# Patient Record
Sex: Female | Born: 1995 | Race: White | Hispanic: No | Marital: Single | Smoking: Former smoker
Health system: Southern US, Community
[De-identification: ages and names within clinical notes are randomized; demographics above are authoritative.]

## PROBLEM LIST (undated history)

## (undated) DIAGNOSIS — F4310 Post-traumatic stress disorder, unspecified: Secondary | ICD-10-CM

## (undated) DIAGNOSIS — F419 Anxiety disorder, unspecified: Secondary | ICD-10-CM

## (undated) DIAGNOSIS — E039 Hypothyroidism, unspecified: Secondary | ICD-10-CM

## (undated) DIAGNOSIS — F32A Depression, unspecified: Secondary | ICD-10-CM

## (undated) DIAGNOSIS — M419 Scoliosis, unspecified: Secondary | ICD-10-CM

## (undated) HISTORY — DX: Depression, unspecified: F32.A

## (undated) HISTORY — DX: Anxiety disorder, unspecified: F41.9

## (undated) HISTORY — DX: Post-traumatic stress disorder, unspecified: F43.10

## (undated) HISTORY — PX: TONSILLECTOMY: SUR1361

---

## 2015-10-31 ENCOUNTER — Encounter (HOSPITAL_COMMUNITY): Payer: Self-pay | Admitting: Certified Nurse Midwife

## 2015-10-31 ENCOUNTER — Inpatient Hospital Stay (HOSPITAL_COMMUNITY)
Admission: AD | Admit: 2015-10-31 | Discharge: 2015-11-08 | DRG: 769 | Disposition: A | Discharge status: Home / Self Care | Payer: Medicaid Other | Source: Ambulatory Visit | Attending: Obstetrics & Gynecology | Admitting: Obstetrics & Gynecology

## 2015-10-31 ENCOUNTER — Inpatient Hospital Stay (HOSPITAL_COMMUNITY): Payer: Medicaid Other

## 2015-10-31 DIAGNOSIS — O99335 Smoking (tobacco) complicating the puerperium: Secondary | ICD-10-CM | POA: Diagnosis present

## 2015-10-31 DIAGNOSIS — L0291 Cutaneous abscess, unspecified: Secondary | ICD-10-CM | POA: Diagnosis present

## 2015-10-31 DIAGNOSIS — L02211 Cutaneous abscess of abdominal wall: Secondary | ICD-10-CM | POA: Diagnosis not present

## 2015-10-31 DIAGNOSIS — Z880 Allergy status to penicillin: Secondary | ICD-10-CM | POA: Diagnosis not present

## 2015-10-31 DIAGNOSIS — K9289 Other specified diseases of the digestive system: Secondary | ICD-10-CM | POA: Diagnosis present

## 2015-10-31 DIAGNOSIS — I4892 Unspecified atrial flutter: Secondary | ICD-10-CM | POA: Diagnosis present

## 2015-10-31 DIAGNOSIS — O8689 Other specified puerperal infections: Secondary | ICD-10-CM | POA: Diagnosis not present

## 2015-10-31 DIAGNOSIS — T814XXS Infection following a procedure, sequela: Secondary | ICD-10-CM

## 2015-10-31 DIAGNOSIS — Z8679 Personal history of other diseases of the circulatory system: Secondary | ICD-10-CM

## 2015-10-31 DIAGNOSIS — O902 Hematoma of obstetric wound: Secondary | ICD-10-CM

## 2015-10-31 DIAGNOSIS — B370 Candidal stomatitis: Secondary | ICD-10-CM | POA: Diagnosis present

## 2015-10-31 DIAGNOSIS — N732 Unspecified parametritis and pelvic cellulitis: Secondary | ICD-10-CM | POA: Diagnosis present

## 2015-10-31 DIAGNOSIS — F17210 Nicotine dependence, cigarettes, uncomplicated: Secondary | ICD-10-CM | POA: Diagnosis present

## 2015-10-31 DIAGNOSIS — D649 Anemia, unspecified: Secondary | ICD-10-CM | POA: Diagnosis present

## 2015-10-31 DIAGNOSIS — O860 Infection of obstetric surgical wound: Principal | ICD-10-CM | POA: Diagnosis present

## 2015-10-31 DIAGNOSIS — O85 Puerperal sepsis: Secondary | ICD-10-CM | POA: Diagnosis present

## 2015-10-31 DIAGNOSIS — O9963 Diseases of the digestive system complicating the puerperium: Secondary | ICD-10-CM | POA: Diagnosis present

## 2015-10-31 DIAGNOSIS — K219 Gastro-esophageal reflux disease without esophagitis: Secondary | ICD-10-CM | POA: Diagnosis present

## 2015-10-31 DIAGNOSIS — O9081 Anemia of the puerperium: Secondary | ICD-10-CM | POA: Diagnosis present

## 2015-10-31 DIAGNOSIS — N739 Female pelvic inflammatory disease, unspecified: Secondary | ICD-10-CM | POA: Diagnosis present

## 2015-10-31 DIAGNOSIS — Z9889 Other specified postprocedural states: Secondary | ICD-10-CM | POA: Diagnosis not present

## 2015-10-31 DIAGNOSIS — IMO0001 Reserved for inherently not codable concepts without codable children: Secondary | ICD-10-CM

## 2015-10-31 HISTORY — DX: Hypothyroidism, unspecified: E03.9

## 2015-10-31 HISTORY — DX: Scoliosis, unspecified: M41.9

## 2015-10-31 LAB — COMPREHENSIVE METABOLIC PANEL
ALBUMIN: 1.9 g/dL — AB (ref 3.5–5.0)
ALT: 8 U/L — AB (ref 14–54)
AST: 11 U/L — AB (ref 15–41)
Alkaline Phosphatase: 150 U/L — ABNORMAL HIGH (ref 38–126)
Anion gap: 9 (ref 5–15)
BUN: 6 mg/dL (ref 6–20)
CHLORIDE: 102 mmol/L (ref 101–111)
CO2: 24 mmol/L (ref 22–32)
CREATININE: 0.64 mg/dL (ref 0.44–1.00)
Calcium: 8.1 mg/dL — ABNORMAL LOW (ref 8.9–10.3)
GFR calc Af Amer: >60 mL/min (ref >60)
GFR calc non Af Amer: >60 mL/min (ref >60)
Glucose, Bld: 87 mg/dL (ref 65–99)
Potassium: 3.6 mmol/L (ref 3.5–5.1)
SODIUM: 135 mmol/L (ref 135–145)
Total Bilirubin: 0.9 mg/dL (ref 0.3–1.2)
Total Protein: 6.6 g/dL (ref 6.5–8.1)

## 2015-10-31 LAB — CREATININE, SERUM
Creatinine, Ser: 0.68 mg/dL (ref 0.44–1.00)
GFR calc Af Amer: >60 mL/min (ref >60)

## 2015-10-31 LAB — CBC
HEMATOCRIT: 26.5 % — AB (ref 36.0–46.0)
HEMOGLOBIN: 9.1 g/dL — AB (ref 12.0–15.0)
MCH: 31.1 pg (ref 26.0–34.0)
MCHC: 34.3 g/dL (ref 30.0–36.0)
MCV: 90.4 fL (ref 78.0–100.0)
Platelets: 509 10*3/uL — ABNORMAL HIGH (ref 150–400)
RBC: 2.93 MIL/uL — AB (ref 3.87–5.11)
RDW: 14.3 % (ref 11.5–15.5)
WBC: 23.8 10*3/uL — ABNORMAL HIGH (ref 4.0–10.5)

## 2015-10-31 LAB — ABO/RH: ABO/RH(D): O POS

## 2015-10-31 LAB — PREPARE RBC (CROSSMATCH)

## 2015-10-31 MED ORDER — NYSTATIN 100000 UNIT/ML MT SUSP
5.0000 mL | Freq: Four times a day (QID) | OROMUCOSAL | Status: DC
  Administered 2015-10-31 – 2015-11-06 (×20): 500000 [IU] via ORAL
  Filled 2015-10-31 (×39): qty 5

## 2015-10-31 MED ORDER — SODIUM CHLORIDE 0.9 % IV SOLN: Freq: Once | INTRAVENOUS | Status: DC

## 2015-10-31 MED ORDER — CLINDAMYCIN PHOSPHATE 900 MG/50ML IV SOLN
900.0000 mg | Freq: Three times a day (TID) | INTRAVENOUS | Status: DC
  Filled 2015-10-31 (×2): qty 50

## 2015-10-31 MED ORDER — VANCOMYCIN HCL IN DEXTROSE 750-5 MG/150ML-% IV SOLN
750.0000 mg | Freq: Two times a day (BID) | INTRAVENOUS | Status: DC
  Filled 2015-10-31: qty 150

## 2015-10-31 MED ORDER — IOPAMIDOL (ISOVUE-300) INJECTION 61%
100.0000 mL | Freq: Once | INTRAVENOUS | Status: AC | PRN
Start: 2015-10-31 — End: 2015-10-31
  Administered 2015-10-31: 100 mL via INTRAVENOUS

## 2015-10-31 MED ORDER — ONDANSETRON HCL 4 MG/2ML IJ SOLN
4.0000 mg | Freq: Four times a day (QID) | INTRAMUSCULAR | Status: DC | PRN
  Administered 2015-11-02 – 2015-11-03 (×2): 4 mg via INTRAVENOUS
  Filled 2015-10-31 (×4): qty 2

## 2015-10-31 MED ORDER — PNEUMOCOCCAL VAC POLYVALENT 25 MCG/0.5ML IJ INJ
0.5000 mL | INJECTION | Freq: Once | INTRAMUSCULAR | Status: DC
  Filled 2015-10-31: qty 0.5

## 2015-10-31 MED ORDER — ACETAMINOPHEN 650 MG RE SUPP
650.0000 mg | Freq: Four times a day (QID) | RECTAL | Status: DC | PRN
  Administered 2015-10-31: 650 mg via RECTAL
  Filled 2015-10-31: qty 1

## 2015-10-31 MED ORDER — DEXTROSE-NACL 5-0.45 % IV SOLN
INTRAVENOUS | Status: DC
  Administered 2015-10-31 – 2015-11-06 (×8): via INTRAVENOUS

## 2015-10-31 MED ORDER — HYDROMORPHONE HCL 1 MG/ML IJ SOLN
0.5000 mg | Freq: Once | INTRAMUSCULAR | Status: AC
  Administered 2015-10-31: 0.5 mg via INTRAVENOUS
  Filled 2015-10-31: qty 1

## 2015-10-31 MED ORDER — SODIUM CHLORIDE 0.9 % IV SOLN
1000.0000 mg | Freq: Three times a day (TID) | INTRAVENOUS | Status: DC
  Administered 2015-11-01 – 2015-11-06 (×17): 1000 mg via INTRAVENOUS
  Filled 2015-10-31 (×19): qty 1000

## 2015-10-31 MED ORDER — PHENOL 1.4 % MT LIQD
1.0000 | OROMUCOSAL | Status: DC | PRN
  Administered 2015-11-06: 1 via OROMUCOSAL
  Filled 2015-10-31: qty 177

## 2015-10-31 MED ORDER — CLINDAMYCIN PHOSPHATE 900 MG/50ML IV SOLN: 900.0000 mg | Freq: Three times a day (TID) | INTRAVENOUS | Status: DC

## 2015-10-31 MED ORDER — ZOLPIDEM TARTRATE 5 MG PO TABS: 5.0000 mg | ORAL_TABLET | Freq: Every evening | ORAL | Status: DC | PRN

## 2015-10-31 MED ORDER — ENOXAPARIN SODIUM 40 MG/0.4ML ~~LOC~~ SOLN
40.0000 mg | SUBCUTANEOUS | Status: DC
  Administered 2015-10-31 – 2015-11-07 (×8): 40 mg via SUBCUTANEOUS
  Filled 2015-10-31 (×9): qty 0.4

## 2015-10-31 MED ORDER — VANCOMYCIN HCL IN DEXTROSE 1-5 GM/200ML-% IV SOLN
1000.0000 mg | Freq: Two times a day (BID) | INTRAVENOUS | Status: DC
  Administered 2015-10-31 – 2015-11-02 (×4): 1000 mg via INTRAVENOUS
  Filled 2015-10-31 (×4): qty 200

## 2015-10-31 MED ORDER — DEXTROSE 5 % IV SOLN
1.0000 g | Freq: Three times a day (TID) | INTRAVENOUS | Status: DC
  Administered 2015-10-31: 1 g via INTRAVENOUS
  Filled 2015-10-31 (×2): qty 1

## 2015-10-31 MED ORDER — HYDROMORPHONE HCL 1 MG/ML IJ SOLN
1.0000 mg | INTRAMUSCULAR | Status: DC | PRN
  Administered 2015-10-31 (×2): 2 mg via INTRAVENOUS
  Administered 2015-10-31: 1 mg via INTRAVENOUS
  Administered 2015-11-01 (×2): 2 mg via INTRAVENOUS
  Administered 2015-11-01: 1 mg via INTRAVENOUS
  Administered 2015-11-01 (×2): 2 mg via INTRAVENOUS
  Administered 2015-11-02: 1 mg via INTRAVENOUS
  Administered 2015-11-02 (×3): 2 mg via INTRAVENOUS
  Administered 2015-11-02 – 2015-11-03 (×2): 1 mg via INTRAVENOUS
  Administered 2015-11-03 (×4): 2 mg via INTRAVENOUS
  Administered 2015-11-03: 1 mg via INTRAVENOUS
  Administered 2015-11-04: 2 mg via INTRAVENOUS
  Administered 2015-11-04 (×2): 1 mg via INTRAVENOUS
  Administered 2015-11-04: 2 mg via INTRAVENOUS
  Filled 2015-10-31: qty 2
  Filled 2015-10-31 (×2): qty 1
  Filled 2015-10-31 (×4): qty 2
  Filled 2015-10-31: qty 1
  Filled 2015-10-31 (×5): qty 2
  Filled 2015-10-31: qty 1
  Filled 2015-10-31: qty 2
  Filled 2015-10-31: qty 1
  Filled 2015-10-31 (×5): qty 2
  Filled 2015-10-31: qty 1

## 2015-10-31 MED ORDER — OXYCODONE-ACETAMINOPHEN 5-325 MG PO TABS
1.0000 | ORAL_TABLET | Freq: Four times a day (QID) | ORAL | Status: DC | PRN
  Administered 2015-10-31 – 2015-11-03 (×4): 2 via ORAL
  Filled 2015-10-31 (×4): qty 2

## 2015-10-31 MED ORDER — ACETAMINOPHEN 325 MG PO TABS
650.0000 mg | ORAL_TABLET | Freq: Four times a day (QID) | ORAL | Status: DC | PRN
  Administered 2015-11-01 – 2015-11-04 (×3): 650 mg via ORAL
  Filled 2015-10-31 (×3): qty 2

## 2015-10-31 MED ORDER — METOPROLOL TARTRATE 25 MG PO TABS
25.0000 mg | ORAL_TABLET | Freq: Two times a day (BID) | ORAL | Status: DC
  Administered 2015-10-31 – 2015-11-07 (×3): 25 mg via ORAL
  Filled 2015-10-31 (×19): qty 1

## 2015-10-31 MED ORDER — METOPROLOL TARTRATE 25 MG PO TABS
25.0000 mg | ORAL_TABLET | Freq: Two times a day (BID) | ORAL | Status: DC
  Filled 2015-10-31 (×2): qty 1

## 2015-10-31 NOTE — MAU Note (Signed)
Radiology tech notified that blood was ready in the lab for pt's transfusion. Tech informed nurse that CT would be done shortly and to wait on giving blood transfusion.

## 2015-10-31 NOTE — Progress Notes (Signed)
ANTIBIOTIC CONSULT NOTE - INITIAL  Pharmacy Consult for Vancomycin and Primaxin Indication: Wound infection/ Intra-abdominal infection  Allergies  Allergen Reactions  . Penicillins Anaphylaxis    Has patient had a PCN reaction causing immediate rash, facial/tongue/throat swelling, SOB or lightheadedness with hypotension: Yes Has patient had a PCN reaction causing severe rash involving mucus membranes or skin necrosis: No Has patient had a PCN reaction that required hospitalization Yes Has patient had a PCN reaction occurring within the last 10 years: No If all of the above answers are "NO", then may proceed with Cephalosporin use.   . Sulfa Antibiotics Hives  . Tylenol [Acetaminophen] Swelling   Per my discussion with Ms Maryclare BeanStaley, she states that she has tolerated Amoxicillin multiple times in the past  Patient Measurements: Height: 5\' 2"  (157.5 cm) Weight: 151 lb (68.5 kg) IBW/kg (Calculated) : 50.1   Vital Signs: Temp: 101.2 F (38.4 C) (09/03 1612) Temp Source: Axillary (09/03 1612) BP: 121/75 (09/03 1612) Pulse Rate: 134 (09/03 1612)  Labs:  Recent Labs  10/31/15 1428 10/31/15 1644  WBC 26.8* 23.8*  HGB 5.9* 9.1*  PLT 457* 509*  CREATININE  --  0.68     Microbiology: No results found for this or any previous visit (from the past 720 hour(s)).  Medications:  Azactam 1 gram x 1 in MAU so Imipenem will be delayed for 8 hours  Assessment: 20 y.o. female [id number] s/p C-section 8/24 POD #10. Pt transferred from [facility]'s request. Re-admitted on 8/30 for fever and chills with placement of percutaneous drains. WBC, drainage,  fever continued despite antibiotics. Pt received Aztreonam, Clindamycin and Vanc [facility]. Dr. Erin FullingHarraway Smith consulted ID and they recommended changing antibiotics to Vanc and Imipenem.   Goal of Therapy:  Vancomycin Trough 10-15 mg/L unless MRSA confirmed and then will increase target goal to 15-20.  Plan:  Primaxin 1000mg  IV  q8h Vancomycin 1000 mg IV every 12 hrs  Will check vancomycin trough at steady-state around the 4th dose or when clinically indicated.  Claybon Jabsngel, Melecio Cueto G 10/31/2015,5:47 PM

## 2015-10-31 NOTE — MAU Note (Signed)
Pt arrives via [facility] from [facility] for r/o sepsis.

## 2015-10-31 NOTE — MAU Note (Signed)
Pt taken to rm 320 via stretcher. Report given to Yahoo! IncKim RN.

## 2015-10-31 NOTE — H&P (Signed)
Raven MottsKristian [name] is a 20 y.o. female G2P2002 POD # 10 presenting as a transfer from [facility] [place].  Pt is s/p primary c-section on August 24th for breech presentation.  She was discharged to home on POD #2. Pt was readmitted Aug 30th with fever and chills. She was started on IV atbx and a CT scan was performed which revealed pelvic abscesses x2 9.6 x 4.4 x 3.1cm and 7.1 x 3.5 x 6.3cm.  The abscesses seemed to be communicating on the CT. Pt had percutaneous drains placed Aug 31st. Pt has had fever and elevated WBC despite atbx.  Pt reports no solid food for 2 days.  She reports +flatus and +BM.  She denies N/V. Pt denies dizziness or SOB.  Her initial WBC was 28k.  It ws 22.5 at the time of transfer.  Pt cont'd febrile with a downward trend. Pts urine, blood and wound cx have all been neg to present. She was dx with atrial flutter and was placed on lopressor with resolution of a fluter. She requested transfer to a different facility.   OB History    Gravida Para Term Preterm AB Living   2 2 2     2    SAB TAB Ectopic Multiple Live Births           2     Past Medical History:  Diagnosis Date  . Hypothyroidism   . Scoliosis    Past Surgical History:  Procedure Laterality Date  . TONSILLECTOMY    cesarean section Oct 21, 2015   Family History: family history is not on file. Social History:  reports that she has been smoking Cigarettes.  She has been smoking about 0.50 packs per day. She has never used smokeless tobacco. She reports that she does not drink alcohol or use drugs.  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   Allergies  Allergen Reactions  . Penicillins Anaphylaxis    Has patient had a PCN reaction causing immediate rash, facial/tongue/throat swelling, SOB or lightheadedness with hypotension: Yes Has patient had a PCN reaction causing severe rash involving mucus membranes or skin necrosis: No Has patient had a PCN reaction that required hospitalization Yes Has patient  had a PCN reaction occurring within the last 10 years: No If all of the above answers are "NO", then may proceed with Cephalosporin use.   . Sulfa Antibiotics Hives  . Tylenol [Acetaminophen] Swelling  [facility] problems  Review of Systems  Neurological: Focal weakness: .bmp.  (error)  History   Blood pressure 114/65, pulse 116, temperature 101.8 F (38.8 C), temperature source Axillary, resp. rate 24, SpO2 100 %. Exam Physical Exam  General Appearance:    Alert, cooperative, appears ill, appears stated age; teary with questioning and exam  Head:    Normocephalic, without obvious abnormality, atraumatic  Eyes:    conjunctiva/corneas clear, EOM's intact, both eyes     Nose:   Nares normal, septum midline, mucosa normal, no drainage    or sinus tenderness  Throat:   Dry mucosa membranes  Neck:   Supple, symmetrical, trachea midline, no adenopathy;    thyroid:  no enlargement/tenderness/nodules  Back:     Symmetric, no curvature, ROM normal, no CVA tenderness  Lungs:     Clear to auscultation bilaterally, respirations unlabored  Chest Wall:    No tenderness or deformity   Heart:    Regular rate and rhythm, S1 and S2 normal, no murmur, rub  or gallop  Breast Exam:    No tenderness, masses, or nipple abnormality  Abdomen:     Soft, diffusely tender, no rebound but voluntary guarding, bowel sounds active all four quadrants.  Incision: clean dry and intact. Thee are 2 perc drains in the patient LLQ.  These both have a foul smelling dark red to brown drainage.  The perc site looks clean and dry.  There are no palpable masses or organomegaly  Genitalia:  Not done     Extremities:   Extremities normal, atraumatic, no cyanosis or edema  Pulses:   2+ and symmetric all extremities  Skin:   Skin color, texture, turgor normal, no rashes or lesions   CBC    Component Value Date/Time   WBC 23.8 (H) 10/31/2015 1644   RBC 2.93 (L) 10/31/2015 1644   HGB 9.1 (L)  10/31/2015 1644   HCT 26.5 (L) 10/31/2015 1644   PLT 509 (H) 10/31/2015 1644   MCV 90.4 10/31/2015 1644   MCH 31.1 10/31/2015 1644   MCHC 34.3 10/31/2015 1644   RDW 14.3 10/31/2015 1644   LYMPHSABS 2.1 10/31/2015 1428   MONOABS 1.6 (H) 10/31/2015 1428   EOSABS 0.0 10/31/2015 1428   BASOSABS 0.0 10/31/2015 1428    CMP Latest Ref Rng & Units 10/31/2015  Creatinine 0.44 - 1.00 mg/dL 1.30   Assessment/Plan: POD# 10 s/p primary c-section Post op pelvic abscess  S/p admission for IV Atbx and  S/p perc drain placement on POD# 7  Pts [facility] chart ws reviewed in its entirety for this past hospitalization  ID:  Pelvic abscess  Blood cx  Repeat CBC  Discussed with ID on call will change atbx to Imipenem and keep Vancomycin  Repeat pelvic/abd CT with oral and IV contrast. Discussed with rad need to r/o communication of  abscess to bowel given the character and odor of the perc drainage. Anemia:  Initial CBC with hgb of 5.  A repeat reveal a Hgb of 9.1.  Will NOT transfuse at present.  Cardiac: atrial flutter dx while at [facility]. Pt s/p cardiology consult 10/29/2015.  Thought due to  underlying infection   Will cont Lopressor and observe.   DVT prophylaxis:  Lovenox 40mg  Iv daily     HARRAWAY-SMITH, Fantasha Daniele 10/31/2015, 3:20 PM

## 2015-11-01 ENCOUNTER — Other Ambulatory Visit: Payer: Self-pay | Admitting: Obstetrics & Gynecology

## 2015-11-01 LAB — COMPREHENSIVE METABOLIC PANEL
ALBUMIN: 1.9 g/dL — AB (ref 3.5–5.0)
ALT: 7 U/L — ABNORMAL LOW (ref 14–54)
ANION GAP: 9 (ref 5–15)
AST: 13 U/L — ABNORMAL LOW (ref 15–41)
Alkaline Phosphatase: 141 U/L — ABNORMAL HIGH (ref 38–126)
BILIRUBIN TOTAL: 0.8 mg/dL (ref 0.3–1.2)
BUN: <5 mg/dL — ABNORMAL LOW (ref 6–20)
CO2: 23 mmol/L (ref 22–32)
Calcium: 7.8 mg/dL — ABNORMAL LOW (ref 8.9–10.3)
Chloride: 105 mmol/L (ref 101–111)
Creatinine, Ser: 0.66 mg/dL (ref 0.44–1.00)
GFR calc Af Amer: >60 mL/min (ref >60)
Glucose, Bld: 116 mg/dL — ABNORMAL HIGH (ref 65–99)
POTASSIUM: 4 mmol/L (ref 3.5–5.1)
Sodium: 137 mmol/L (ref 135–145)
TOTAL PROTEIN: 6.3 g/dL — AB (ref 6.5–8.1)

## 2015-11-01 LAB — CBC WITH DIFFERENTIAL/PLATELET
BASOS PCT: 0 %
Basophils Absolute: 0.1 10*3/uL (ref 0.0–0.1)
Eosinophils Absolute: 0.1 10*3/uL (ref 0.0–0.7)
Eosinophils Relative: 0 %
HEMATOCRIT: 24.5 % — AB (ref 36.0–46.0)
Hemoglobin: 8.4 g/dL — ABNORMAL LOW (ref 12.0–15.0)
Lymphocytes Relative: 7 %
Lymphs Abs: 1.9 10*3/uL (ref 0.7–4.0)
MCH: 30.9 pg (ref 26.0–34.0)
MCHC: 34.3 g/dL (ref 30.0–36.0)
MCV: 90.1 fL (ref 78.0–100.0)
MONO ABS: 1.4 10*3/uL — AB (ref 0.1–1.0)
MONOS PCT: 5 %
NEUTROS ABS: 23.1 10*3/uL — AB (ref 1.7–7.7)
Neutrophils Relative %: 87 %
Platelets: 491 10*3/uL — ABNORMAL HIGH (ref 150–400)
RBC: 2.72 MIL/uL — ABNORMAL LOW (ref 3.87–5.11)
RDW: 14.6 % (ref 11.5–15.5)
WBC: 26.5 10*3/uL — ABNORMAL HIGH (ref 4.0–10.5)

## 2015-11-01 LAB — MRSA PCR SCREENING: MRSA by PCR: NEGATIVE

## 2015-11-01 NOTE — Progress Notes (Signed)
Subjective: Postpartum Day #11s/p [facility] Delivery [facility].  Pt now with Day #6 of IV atbx.  Patient reports + flatus.  She ate a small amount of food yesterday.  She continues to reports feeling hungry but, her throat is hurting.  She reports less pain in her mouth and throat.  Pt reports less pain than on admission and her partner reports that she looks better than she did prior to transfer.  She reports that she slept last night for the first time.  Objective: Vital signs in last 24 hours: Temp:  [99.3 F (37.4 C)-102.7 F (39.3 C)] 102.7 F (39.3 C) (09/04 0530) Pulse Rate:  [99-134] 127 (09/04 0530) Resp:  [18-26] 18 (09/04 0530) BP: (94-121)/(51-75) 94/51 (09/04 0530) SpO2:  [96 %-100 %] 96 % (09/04 0530) Weight:  [151 lb (68.5 kg)-152 lb 3 oz (69 kg)] 152 lb 3 oz (69 kg) (09/04 0545)  Physical Exam:  General: pt was initially asleep but, upon awakening she became teary   Lungs: CTA CV: RRR Abd; +BS; diffusely tender; no rebound; there is erythema around the incision but, no drainage. The incision itself is intact without drainage.  The perc drains cont to drain brown fluid that is more clear than yesterday. Uterine Fundus: firm DVT Evaluation: No evidence of DVT seen on physical exam. CBC Latest Ref Rng & Units 11/01/2015 10/31/2015 10/31/2015  WBC 4.0 - 10.5 K/uL 26.5(H) 23.8(H) 26.8(H)  Hemoglobin 12.0 - 15.0 g/dL 1.6(X8.4(L) 0.9(U9.1(L) QUESTIONABLE RESULTS  Hematocrit 36.0 - 46.0 % 24.5(L) 26.5(L) QUESTIONABLE RESULTS  Platelets 150 - 400 K/uL 491(H) 509(H) 457(H)   CMP Latest Ref Rng & Units 11/01/2015 10/31/2015 10/31/2015  Glucose 65 - 99 mg/dL 045(W116(H) 87 -  BUN 6 - 20 mg/dL <0(J<5(L) 6 -  Creatinine 8.110.44 - 1.00 mg/dL 9.140.66 7.820.64 9.560.68  Sodium 135 - 145 mmol/L 137 135 -  Potassium 3.5 - 5.1 mmol/L 4.0 3.6 -  Chloride 101 - 111 mmol/L 105 102 -  CO2 22 - 32 mmol/L 23 24 -  Calcium 8.9 - 10.3 mg/dL 7.8(L) 8.1(L) -  Total Protein 6.5 - 8.1 g/dL 6.3(L) 6.6 -  Total Bilirubin 0.3 - 1.2  mg/dL 0.8 0.9 -  Alkaline Phos 38 - 126 U/L 141(H) 150(H) -  AST 15 - 41 U/L 13(L) 11(L) -  ALT 14 - 54 U/L 7(L) 8(L) -   CLINICAL DATA:  20 year old female with recurrent pelvic abscesses following C-section.  EXAM: CT ABDOMEN AND PELVIS WITH CONTRAST  TECHNIQUE: Multidetector CT imaging of the abdomen and pelvis was performed using the standard protocol following bolus administration of intravenous contrast.  CONTRAST:  100mL ISOVUE-300 IOPAMIDOL (ISOVUE-300) INJECTION 61%  COMPARISON:  10/28/2015 and [facility]  FINDINGS: Lower chest:  Unremarkable  Hepatobiliary: The liver and gallbladder are unremarkable.  Pancreas: Unremarkable  Spleen: Unremarkable  Adrenals/Urinary Tract: Fullness of the intrarenal collecting systems noted. The kidneys, adrenal glands are otherwise unremarkable. Gallbladder wall thickening identified.  Stomach/Bowel: No bowel obstruction or definite bowel wall thickening.  Vascular/Lymphatic: No enlarged lymph nodes or abdominal aortic aneurysm.  Reproductive: Gas within the endometrial canal has decreased. The uterus is enlarged compatible with recent postpartum state. Two pelvic drains are identified with a tiny amount of residual fluid along the drain tips. A small amount of ill-defined residual fluid and gas adjacent to the uterus noted.  A 3.5 x 9 cm anterior right lower pelvic subcutaneous abscess and a 2 x 3.1 cm anterior left lower pelvic  subcutaneous abscess have now performed.  Other: No free fluid.  Musculoskeletal: No acute bony abnormality.  IMPRESSION: New anterior subcutaneous lower pelvic abscesses measuring 3.5 x 9 cm on the right and 2 x 3.1 cm on the left.  Significant decrease of intrapelvic abscesses with tiny amount of residual fluid along the 2 percutaneous drain tips. Small amount of ill-defined residual fluid and gas adjacent to the uterus still present.  Fullness of  bilateral intrarenal collecting systems Assessment/Plan: POD# 11 s/p primary c-section Post op pelvic abscess  S/p admission for IV Atbx   S/p perc drain placement on POD# 8 Oral thrush Aflutter    ID:  Pelvic abscess                       Blood cx                       Repeat CBC in am                       cont Imipenem and Vancomycin day #6 atbx/ Day #2 current regimen                        pelvic/abd CT with oral and IV contrast- decrease in the size of the abscesses.  I am still   concerned about the nature of the perc drainage.  Oncoming team will consult gen surgery   to eval  Anemia:                       Stable. Will follow.   Cardiac: atrial flutter dx while at [facility]. Pt s/p cardiology consult 10/29/2015.  Thought due to                     underlying infection                        Will cont Lopressor and observe.    Stable at present  DVT prophylaxis:                       Lovenox 40mg  Iv daily    Oral thrush- on Nystatin- imporved    HARRAWAY-SMITH, Janifer Gieselman 11/01/2015, 9:54 AM

## 2015-11-02 ENCOUNTER — Encounter (HOSPITAL_COMMUNITY)
Admission: AD | Disposition: A | Discharge status: Home / Self Care | Payer: Self-pay | Source: Ambulatory Visit | Attending: Obstetrics & Gynecology

## 2015-11-02 ENCOUNTER — Inpatient Hospital Stay (HOSPITAL_COMMUNITY): Payer: Medicaid Other | Admitting: Certified Registered Nurse Anesthetist

## 2015-11-02 DIAGNOSIS — O9081 Anemia of the puerperium: Secondary | ICD-10-CM

## 2015-11-02 DIAGNOSIS — I4892 Unspecified atrial flutter: Secondary | ICD-10-CM

## 2015-11-02 DIAGNOSIS — B370 Candidal stomatitis: Secondary | ICD-10-CM

## 2015-11-02 DIAGNOSIS — Z9889 Other specified postprocedural states: Secondary | ICD-10-CM

## 2015-11-02 DIAGNOSIS — O860 Infection of obstetric surgical wound: Secondary | ICD-10-CM

## 2015-11-02 DIAGNOSIS — O9963 Diseases of the digestive system complicating the puerperium: Secondary | ICD-10-CM

## 2015-11-02 DIAGNOSIS — L0291 Cutaneous abscess, unspecified: Secondary | ICD-10-CM | POA: Diagnosis present

## 2015-11-02 DIAGNOSIS — Z880 Allergy status to penicillin: Secondary | ICD-10-CM

## 2015-11-02 DIAGNOSIS — O902 Hematoma of obstetric wound: Secondary | ICD-10-CM

## 2015-11-02 DIAGNOSIS — N732 Unspecified parametritis and pelvic cellulitis: Secondary | ICD-10-CM

## 2015-11-02 DIAGNOSIS — O99335 Smoking (tobacco) complicating the puerperium: Secondary | ICD-10-CM

## 2015-11-02 HISTORY — PX: WOUND EXPLORATION: SHX6188

## 2015-11-02 LAB — CBC WITH DIFFERENTIAL/PLATELET

## 2015-11-02 LAB — VANCOMYCIN, TROUGH: VANCOMYCIN TR: 5 ug/mL — AB (ref 15–20)

## 2015-11-02 SURGERY — WOUND EXPLORATION
Anesthesia: General | Site: Abdomen

## 2015-11-02 MED ORDER — FENTANYL CITRATE (PF) 100 MCG/2ML IJ SOLN
INTRAMUSCULAR | Status: AC
  Filled 2015-11-02: qty 2

## 2015-11-02 MED ORDER — FENTANYL CITRATE (PF) 100 MCG/2ML IJ SOLN
25.0000 ug | INTRAMUSCULAR | Status: DC | PRN
  Administered 2015-11-02 (×3): 50 ug via INTRAVENOUS

## 2015-11-02 MED ORDER — LACTATED RINGERS IV SOLN
INTRAVENOUS | Status: DC | PRN
  Administered 2015-11-02 (×2): via INTRAVENOUS

## 2015-11-02 MED ORDER — BUPIVACAINE HCL (PF) 0.5 % IJ SOLN
INTRAMUSCULAR | Status: AC
  Filled 2015-11-02: qty 30

## 2015-11-02 MED ORDER — LACTATED RINGERS IV SOLN
INTRAVENOUS | Status: DC
  Administered 2015-11-02: 12:00:00 via INTRAVENOUS

## 2015-11-02 MED ORDER — MIDAZOLAM HCL 2 MG/2ML IJ SOLN
INTRAMUSCULAR | Status: DC | PRN
  Administered 2015-11-02: 2 mg via INTRAVENOUS

## 2015-11-02 MED ORDER — LIDOCAINE HCL (CARDIAC) 20 MG/ML IV SOLN
INTRAVENOUS | Status: AC
  Filled 2015-11-02: qty 5

## 2015-11-02 MED ORDER — PROPOFOL 10 MG/ML IV BOLUS
INTRAVENOUS | Status: AC
  Filled 2015-11-02: qty 20

## 2015-11-02 MED ORDER — KETOROLAC TROMETHAMINE 30 MG/ML IJ SOLN
INTRAMUSCULAR | Status: DC | PRN
  Administered 2015-11-02: 30 mg via INTRAVENOUS

## 2015-11-02 MED ORDER — PROPOFOL 10 MG/ML IV BOLUS
INTRAVENOUS | Status: DC | PRN
  Administered 2015-11-02: 170 mg via INTRAVENOUS

## 2015-11-02 MED ORDER — HYDROMORPHONE HCL 1 MG/ML IJ SOLN
INTRAMUSCULAR | Status: AC
  Filled 2015-11-02: qty 1

## 2015-11-02 MED ORDER — SODIUM CHLORIDE 0.9 % IR SOLN
Freq: Once | Status: AC
  Administered 2015-11-02: 500 mL
  Filled 2015-11-02: qty 1

## 2015-11-02 MED ORDER — MIDAZOLAM HCL 2 MG/2ML IJ SOLN
INTRAMUSCULAR | Status: AC
  Filled 2015-11-02: qty 2

## 2015-11-02 MED ORDER — ONDANSETRON HCL 4 MG/2ML IJ SOLN
INTRAMUSCULAR | Status: DC | PRN
  Administered 2015-11-02: 4 mg via INTRAVENOUS

## 2015-11-02 MED ORDER — PROMETHAZINE HCL 25 MG/ML IJ SOLN: 6.2500 mg | INTRAMUSCULAR | Status: DC | PRN

## 2015-11-02 MED ORDER — VANCOMYCIN HCL IN DEXTROSE 1-5 GM/200ML-% IV SOLN
1000.0000 mg | Freq: Three times a day (TID) | INTRAVENOUS | Status: DC
  Administered 2015-11-02 – 2015-11-06 (×12): 1000 mg via INTRAVENOUS
  Filled 2015-11-02 (×18): qty 200

## 2015-11-02 MED ORDER — HYDROMORPHONE HCL 1 MG/ML IJ SOLN
INTRAMUSCULAR | Status: DC | PRN
  Administered 2015-11-02 (×2): 1 mg via INTRAVENOUS

## 2015-11-02 MED ORDER — BUPIVACAINE HCL (PF) 0.5 % IJ SOLN
INTRAMUSCULAR | Status: DC | PRN
  Administered 2015-11-02: 30 mL

## 2015-11-02 MED ORDER — LIDOCAINE HCL (CARDIAC) 20 MG/ML IV SOLN
INTRAVENOUS | Status: DC | PRN
  Administered 2015-11-02: 70 mg via INTRAVENOUS

## 2015-11-02 MED ORDER — FENTANYL CITRATE (PF) 100 MCG/2ML IJ SOLN
INTRAMUSCULAR | Status: AC
  Administered 2015-11-02: 50 ug via INTRAVENOUS
  Filled 2015-11-02: qty 2

## 2015-11-02 MED ORDER — FENTANYL CITRATE (PF) 100 MCG/2ML IJ SOLN
INTRAMUSCULAR | Status: DC | PRN
  Administered 2015-11-02: 50 ug via INTRAVENOUS
  Administered 2015-11-02: 25 ug via INTRAVENOUS
  Administered 2015-11-02 (×2): 50 ug via INTRAVENOUS
  Administered 2015-11-02: 25 ug via INTRAVENOUS
  Administered 2015-11-02 (×4): 50 ug via INTRAVENOUS

## 2015-11-02 MED FILL — Fentanyl Citrate Preservative Free (PF) Inj 100 MCG/2ML: INTRAMUSCULAR | Qty: 2 | Status: AC

## 2015-11-02 SURGICAL SUPPLY — 24 items
APPLICATOR COTTON TIP 6IN STRL (MISCELLANEOUS) ×3 IMPLANT
CANISTER SUCT 3000ML (MISCELLANEOUS) ×3 IMPLANT
CLOTH BEACON ORANGE TIMEOUT ST (SAFETY) ×3 IMPLANT
CONTAINER PREFILL 10% NBF 60ML (FORM) IMPLANT
DECANTER SPIKE VIAL GLASS SM (MISCELLANEOUS) ×3 IMPLANT
DRESSING DISP NPWT PICO 4X12 (MISCELLANEOUS) ×3 IMPLANT
DURAPREP 26ML APPLICATOR (WOUND CARE) ×3 IMPLANT
GAUZE SPONGE 4X4 16PLY XRAY LF (GAUZE/BANDAGES/DRESSINGS) IMPLANT
GLOVE BIOGEL PI IND STRL 7.0 (GLOVE) ×1 IMPLANT
GLOVE BIOGEL PI INDICATOR 7.0 (GLOVE) ×2
GOWN STRL REUS W/TWL LRG LVL3 (GOWN DISPOSABLE) ×6 IMPLANT
GOWN STRL REUS W/TWL XL LVL3 (GOWN DISPOSABLE) ×3 IMPLANT
NEEDLE HYPO 22GX1.5 SAFETY (NEEDLE) ×3 IMPLANT
NS IRRIG 1000ML POUR BTL (IV SOLUTION) ×3 IMPLANT
PACK ABDOMINAL GYN (CUSTOM PROCEDURE TRAY) ×3 IMPLANT
PAD OB MATERNITY 4.3X12.25 (PERSONAL CARE ITEMS) ×3 IMPLANT
PROTECTOR NERVE ULNAR (MISCELLANEOUS) ×3 IMPLANT
SPONGE GAUZE 4X4 12PLY STER LF (GAUZE/BANDAGES/DRESSINGS) IMPLANT
SPONGE LAP 18X18 X RAY DECT (DISPOSABLE) IMPLANT
STAPLER VISISTAT 35W (STAPLE) IMPLANT
SYR CONTROL 10ML LL (SYRINGE) ×3 IMPLANT
TOWEL OR 17X24 6PK STRL BLUE (TOWEL DISPOSABLE) ×6 IMPLANT
TRAY FOLEY CATH SILVER 14FR (SET/KITS/TRAYS/PACK) ×3 IMPLANT
WATER STERILE IRR 1000ML POUR (IV SOLUTION) ×3 IMPLANT

## 2015-11-02 NOTE — Progress Notes (Signed)
JP drain dressing changed. New 4x4 gauze applied.

## 2015-11-02 NOTE — Anesthesia Procedure Notes (Signed)
Procedure Name: LMA Insertion Date/Time: 11/02/2015 9:12 AM Performed by: Yolonda KidaARVER, Tabitha Tupper L Pre-anesthesia Checklist: Patient identified, Emergency Drugs available, Suction available and Patient being monitored Patient Re-evaluated:Patient Re-evaluated prior to inductionOxygen Delivery Method: Circle system utilized Preoxygenation: Pre-oxygenation with 100% oxygen Intubation Type: IV induction LMA: LMA inserted LMA Size: 4.0 Number of attempts: 1 Placement Confirmation: positive ETCO2,  CO2 detector and breath sounds checked- equal and bilateral Tube secured with: Tape Dental Injury: Teeth and Oropharynx as per pre-operative assessment

## 2015-11-02 NOTE — Transfer of Care (Signed)
Immediate Anesthesia Transfer of Care Note  Patient: Raven [name]  Procedure(s) Performed: Procedure(s): WOUND EXPLORATION (N/A)  Patient Location: PACU  Anesthesia Type:General  Level of Consciousness: awake, alert , oriented and patient cooperative  Airway & Oxygen Therapy: Patient Spontanous Breathing and Patient connected to nasal cannula oxygen  Post-op Assessment: Report given to RN and Post -op Vital signs reviewed and stable  Post vital signs: Reviewed and stable  Last Vitals:  Vitals:   11/02/15 0700 11/02/15 0800  BP:  (!) 106/58  Pulse:  100  Resp:  18  Temp: 37.5 C 36.8 C    Last Pain:  Vitals:   11/02/15 0800  TempSrc: Oral  PainSc:       Patients Stated Pain Goal: 3 (11/02/15 0741)  Complications: No apparent anesthesia complications

## 2015-11-02 NOTE — Brief Op Note (Signed)
10/31/2015 - 11/02/2015  10:01 AM  PATIENT:  Raven [name]  20 y.o. female  PRE-OPERATIVE DIAGNOSIS:  pelvic abscess; subcutaneous wound abscess   POST-OPERATIVE DIAGNOSIS:  pelvic abscess; subcutaneous wound abscess   PROCEDURE:  Procedure(s): WOUND EXPLORATION (N/A)  SURGEON:  Surgeon(s) and Role:    * Willodean Rosenthalarolyn Harraway-Smith, MD - Primary  ASSISTANTS: [place] Bingharlie Pickens, MD   ANESTHESIA:   general  EBL:  Total I/O In: 1635.4 [I.V.:1635.4] Out: 520 [Urine:500; Blood:20]  BLOOD ADMINISTERED:none  DRAINS: PICO wound vac placed   LOCAL MEDICATIONS USED:  MARCAINE     SPECIMEN:  No Specimen  DISPOSITION OF SPECIMEN:  N/A  COUNTS:  YES  TOURNIQUET:  * No tourniquets in log *  DICTATION: .Note written in EPIC  PLAN OF CARE: Patiet has active admission order  PATIENT DISPOSITION:  PACU - hemodynamically stable.   Delay start of Pharmacological VTE agent (>24hrs) due to surgical blood loss or risk of bleeding: Patinet is already on Lovenox.  Complications: none immediate  Nastasia Kage L. Harraway-Smith, M.D., Evern CoreFACOG

## 2015-11-02 NOTE — Anesthesia Postprocedure Evaluation (Signed)
Anesthesia Post Note  Patient: Raven [name]  Procedure(s) Performed: Procedure(s) (LRB): WOUND EXPLORATION (N/A)  Patient location during evaluation: [facility] Anesthesia Type: General Level of consciousness: awake and alert Pain management: pain level controlled Vital Signs Assessment: post-procedure vital signs reviewed and stable Respiratory status: spontaneous breathing Cardiovascular status: blood pressure returned to baseline and stable Postop Assessment: no signs of nausea or vomiting and adequate PO intake     Last Vitals:  Vitals:   11/02/15 1145 11/02/15 1245  BP: (!) 92/44 (!) 97/56  Pulse: 76 77  Resp: 20 20  Temp: 36.7 C 36.6 C    Last Pain:  Vitals:   11/02/15 1530  TempSrc:   PainSc: 3    Pain Goal: Patients Stated Pain Goal: 3 (11/02/15 1530)               Salome ArntSterling, Sutter Ahlgren Marie

## 2015-11-02 NOTE — Progress Notes (Signed)
Patient ID: Raven MottsKristian Mellin, female   DOB: 10-06-1995, 20 y.o.   MRN: [id number] Patient desires surgical management with wound exploration for subcutaneous abscess.  The risks of surgery were discussed in detail with the patient including but not limited to: bleeding which may require transfusion or reoperation; infection which may require prolonged hospitalization or re-hospitalization and antibiotic therapy; injury to bowel, bladder, ureters and major vessels or other surrounding organs; need for additional procedures including laparotomy; thromboembolic phenomenon, incisional problems and other postoperative or anesthesia complications.  Patient was told that the likelihood that her condition and symptoms will be treated effectively with this surgical management was very high; the postoperative expectations were also discussed in detail. The patient also understands the alternative treatment options which were discussed in full. All questions were answered. On call to OR when ready.  Izel Hochberg L. Harraway-Smith, M.D., Evern CoreFACOG

## 2015-11-02 NOTE — Op Note (Signed)
10/31/2015 - 11/02/2015  10:01 AM  PATIENT:  Raven [name]  20 y.o. female  PRE-OPERATIVE DIAGNOSIS:  pelvic abscess; subcutaneous wound abscess   POST-OPERATIVE DIAGNOSIS:  pelvic abscess; subcutaneous wound abscess   PROCEDURE:  Procedure(s): WOUND EXPLORATION (N/A)  SURGEON:  Surgeon(s) and Role:    * Willodean Rosenthalarolyn Harraway-Smith, MD - Primary  ASSISTANTS: [place] Bingharlie Pickens, MD   ANESTHESIA:   general  EBL:  Total I/O In: 1635.4 [I.V.:1635.4] Out: 520 [Urine:500; Blood:20]  BLOOD ADMINISTERED:none  DRAINS: PICO wound vac placed   LOCAL MEDICATIONS USED:  MARCAINE     SPECIMEN:  No Specimen  DISPOSITION OF SPECIMEN:  N/A  COUNTS:  YES  TOURNIQUET:  * No tourniquets in log *  DICTATION: .Note written in EPIC  PLAN OF CARE: Patiet has active admission order  PATIENT DISPOSITION:  PACU - hemodynamically stable.   Delay start of Pharmacological VTE agent (>24hrs) due to surgical blood loss or risk of bleeding: Patinet is already on Lovenox.  Complications: none immediate  INDICATIONS: The patient is a 20 yo G2P2 with the aforementioned diagnoses who desires definitive surgical management. On the preoperative visit, the risks, benefits, indications, and alternatives of the procedure were reviewed with the patient.  On the day of surgery, the risks of surgery were again discussed with the patient including but not limited to: bleeding which may require transfusion or reoperation; infection which may require antibiotics; injury to bowel, bladder, ureters or other surrounding organs; need for additional procedures; thromboembolic phenomenon, incisional problems and other postoperative/anesthesia complications. Written informed consent was obtained.    OPERATIVE FINDINGS: There was ~100cc of foul smelling purulent drainage above the fascia.  The fascia was intact.     There were percutaneous drains x2 in the left lower quadrant above the incision  that were previously placed by  interventional radiology.   DESCRIPTION OF PROCEDURE:  The patient received intravenous antibiotics on call to the OR and had sequential compression devices applied to her lower extremities while in the preoperative area.   She was taken to the operating room and placed under general anesthesia without difficulty. A Foley catheter was inserted into the bladder and attached to constant drainage. The abdomen was prepped and draped in a sterile manner, and she was placed in a dorsal supine position. After an adequate timeout was performed, the patient prev Pfannensteil incision was opened on the right side with the scalpel.  Upon opening the skin a large amount of foul smelling purulent drainage came out of the incision.  The right side was then opened in similar fashion and a smaller amount of foul smelling purulent drainage came out of the incision.  Aerobic and anaerobic cultures were sent. The fascia was probed with a Q-tip and found to be intact.  The wound ws irrigated copiously with antibiotic solution until the fluid was clear. The wound was then again probed and found to be intact.  There were no additional pockets of pus noted.  There was an area of induration about 5 cm above  bovet the incision on the right side.  Using a 14 gage angiocath the area was needled to see if any fluid was noted.  There was no fluid or pus return at this area.  At this point the skin was reapproximated with a few staples about 3-4 cm apart and a PICO wound vac was placed over the insicion.  Sponge, lap and instrument count was correct.  The patient was taken to recovery awake and in  stable conditions.  Raven [name] L. Harraway-Smith, M.D., Evern Core

## 2015-11-02 NOTE — Anesthesia Postprocedure Evaluation (Signed)
Anesthesia Post Note  Patient: Raven [name]  Procedure(s) Performed: Procedure(s) (LRB): WOUND EXPLORATION (N/A)  Patient location during evaluation: PACU Anesthesia Type: General Level of consciousness: awake and alert Pain management: pain level controlled Vital Signs Assessment: post-procedure vital signs reviewed and stable Respiratory status: spontaneous breathing, nonlabored ventilation and respiratory function stable Cardiovascular status: blood pressure returned to baseline and stable Postop Assessment: no signs of nausea or vomiting Anesthetic complications: no     Last Vitals:  Vitals:   11/02/15 1003 11/02/15 1030  BP: 95/61 (!) 92/50  Pulse: 90 86  Resp: 16 18  Temp: 37.1 C     Last Pain:  Vitals:   11/02/15 1030  TempSrc:   PainSc: Asleep   Pain Goal: Patients Stated Pain Goal: 3 (11/02/15 0741)               Raven [name]

## 2015-11-02 NOTE — Anesthesia Preprocedure Evaluation (Addendum)
Anesthesia Evaluation  Patient identified by MRN, date of birth, ID band Patient awake    Reviewed: Allergy & Precautions, NPO status , Patient's Chart, lab work & pertinent test results  History of Anesthesia Complications Negative for: history of anesthetic complications  Airway Mallampati: III  TM Distance: >3 FB Neck ROM: Full    Dental  (+) Dental Advisory Given, Teeth Intact   Pulmonary neg shortness of breath, neg sleep apnea, neg COPD, neg recent URI, Current Smoker,    Pulmonary exam normal breath sounds clear to auscultation       Cardiovascular negative cardio ROS   Rhythm:Regular Rate:Normal     Neuro/Psych negative neurological ROS     GI/Hepatic negative GI ROS, Neg liver ROS, neg GERD  ,  Endo/Other  neg diabetesHypothyroidism   Renal/GU negative Renal ROS     Musculoskeletal scoliosis   Abdominal (+) + obese,   Peds  Hematology negative hematology ROS (+)   Anesthesia Other Findings Pelvic abscess s/p c-section. 12 days postpartum  Reproductive/Obstetrics                           Anesthesia Physical Anesthesia Plan  ASA: II  Anesthesia Plan: General   Post-op Pain Management:    Induction: Intravenous  Airway Management Planned: LMA  Additional Equipment:   Intra-op Plan:   Post-operative Plan: Extubation in OR  Informed Consent: I have reviewed the patients History and Physical, chart, labs and discussed the procedure including the risks, benefits and alternatives for the proposed anesthesia with the patient or authorized representative who has indicated his/her understanding and acceptance.   Dental advisory given  Plan Discussed with:   Anesthesia Plan Comments: (Risks of general anesthesia discussed including, but not limited to, sore throat, hoarse voice, chipped/damaged teeth, injury to vocal cords, nausea and vomiting, allergic reactions, lung  infection, heart attack, stroke, and death. All questions answered. )       Anesthesia Quick Evaluation

## 2015-11-02 NOTE — Progress Notes (Signed)
Procedure(s) (LRB): WOUND EXPLORATION (Bilateral) Posted for today Subjective: Patient reports incisional pain. More pain on right side of incision   Objective: I have reviewed patient's vital signs, intake and output, medications and radiology results. Vitals:   11/02/15 0115 11/02/15 0538  BP: (!) 99/55 (!) 102/58  Pulse: 97 (!) 113  Resp: 18 18  Temp: 99 F (37.2 C) (!) 101.8 F (38.8 C)    General: alert, cooperative and mild distress GI: incision: clean, dry, erythematous and indurated on right  Intake/Output Summary (Last 24 hours) at 11/02/15 0650 Last data filed at 11/02/15 0600  Gross per 24 hour  Intake          4508.75 ml  Output              735 ml  Net          3773.75 ml   Drains 5 and 20 ml, dark fluid CBC    Component Value Date/Time   WBC 26.5 (H) 11/01/2015 0508   RBC 2.72 (L) 11/01/2015 0508   HGB 8.4 (L) 11/01/2015 0508   HCT 24.5 (L) 11/01/2015 0508   PLT 491 (H) 11/01/2015 0508   MCV 90.1 11/01/2015 0508   MCH 30.9 11/01/2015 0508   MCHC 34.3 11/01/2015 0508   RDW 14.6 11/01/2015 0508   LYMPHSABS 1.9 11/01/2015 0508   MONOABS 1.4 (H) 11/01/2015 0508   EOSABS 0.1 11/01/2015 0508   BASOSABS 0.1 11/01/2015 0508    Assessment: : POD 12 after cesarean section Patient Active Problem List   Diagnosis Date Noted  . Postpartum pelvic abscess 10/31/2015  . Pelvic abscess in female 10/31/2015  . Post-operative state 10/31/2015  . Hx of atrial flutter 10/31/2015  Subcutaneous abscess in wound  Plan: Exploration of wound for subcutaneous abscess  LOS: 2 days    ARNOLD,JAMES 11/02/2015, 6:48 AM

## 2015-11-02 NOTE — Plan of Care (Signed)
Problem: Physical Regulation: Goal: Will remain free from infection Outcome: Not Progressing JP drains x 2- purulant drainage- on abx  Problem: Bowel/Gastric: Goal: Will not experience complications related to bowel motility Outcome: Progressing BM 9/5

## 2015-11-02 NOTE — Consult Note (Signed)
Brief consult with this mom who delivered her baby on 8/24, and was readmitted with sepsis. Mom has been formula feeding, so lactation not needed.

## 2015-11-02 NOTE — Addendum Note (Signed)
Addendum  created 11/02/15 1659 by Armanda Heritageharlesetta M Sua Spadafora, CRNA   Sign clinical note

## 2015-11-02 NOTE — Progress Notes (Signed)
  Pharmacy Antibiotic Note  Raven MottsKristian [name] is a 20 y.o. female admitted on 10/31/2015 with pelvic abscess s/p [facility] 8/24 [facility].  Pharmacy has been consulted for Vancomycin & Primaxin dosing.  Plan: Increase Vancomycin to 1 gram IV Q 8hr Continue Primaxin 1 gram IV Q 8hr  Height: 5\' 2"  (157.5 cm) Weight: 188 lb 8 oz (85.5 kg) IBW/kg (Calculated) : 50.1 k g  Temp (24hrs), Avg:99.9 F (37.7 C), Min:98.3 F (36.8 C), Max:101.8 F (38.8 C)   Recent Labs Lab 10/31/15 1428 10/31/15 1644 11/01/15 0508 11/02/15 0522  WBC 26.8* 23.8* 26.5*  --   CREATININE  --  0.64  0.68 0.66  --   VANCOTROUGH  --   --   --  5*    Estimated Creatinine Clearance: 112.9 mL/min (by C-G formula based on SCr of 0.8 mg/dL).    Allergies  Allergen Reactions  . Penicillins Anaphylaxis    Has patient had a PCN reaction causing immediate rash, facial/tongue/throat swelling, SOB or lightheadedness with hypotension: Yes Has patient had a PCN reaction causing severe rash involving mucus membranes or skin necrosis: No Has patient had a PCN reaction that required hospitalization Yes Has patient had a PCN reaction occurring within the last 10 years: No If all of the above answers are "NO", then may proceed with Cephalosporin use.   . Sulfa Antibiotics Hives  . Tylenol [Acetaminophen] Swelling    Antimicrobials this admission: Vancomycin 10/31/15 >>  Primaxin  10/31/15 >  Dose adjustments this admission: Will increase vancomycin to 1 gram IV Q 8hr since measured trough level = 5  (goal target level = 10-15)  Microbiology results:  BCx: pending MRSA PCR: negative  Thank you for allowing pharmacy to be a part of this patient's care.  Natasha BenceCline, Donyae Kohn 11/02/2015 8:58 AM

## 2015-11-03 ENCOUNTER — Encounter (HOSPITAL_COMMUNITY): Payer: Self-pay | Admitting: Certified Registered Nurse Anesthetist

## 2015-11-03 ENCOUNTER — Encounter (HOSPITAL_COMMUNITY): Payer: Self-pay | Admitting: Obstetrics & Gynecology

## 2015-11-03 LAB — CBC
HCT: 22.3 % — ABNORMAL LOW (ref 36.0–46.0)
HEMOGLOBIN: 7.3 g/dL — AB (ref 12.0–15.0)
MCH: 29.9 pg (ref 26.0–34.0)
MCHC: 32.7 g/dL (ref 30.0–36.0)
MCV: 91.4 fL (ref 78.0–100.0)
PLATELETS: 425 10*3/uL — AB (ref 150–400)
RBC: 2.44 MIL/uL — ABNORMAL LOW (ref 3.87–5.11)
RDW: 14.6 % (ref 11.5–15.5)
WBC: 14.3 10*3/uL — ABNORMAL HIGH (ref 4.0–10.5)

## 2015-11-03 MED ORDER — OXYCODONE HCL 5 MG PO TABS
5.0000 mg | ORAL_TABLET | Freq: Four times a day (QID) | ORAL | Status: DC | PRN
  Administered 2015-11-03 – 2015-11-05 (×8): 10 mg via ORAL
  Filled 2015-11-03 (×8): qty 2

## 2015-11-03 NOTE — Progress Notes (Signed)
OB Note Went to check on patient to see how she was doing. Some ambulation in the room and taking PO liquids fine and some solids but doesn't have much of an appetite; no fevers, chills  Patient Vitals for the past 24 hrs:  BP Temp Temp src Pulse Resp SpO2  11/03/15 1400 98/60 98.8 F (37.1 C) - 90 20 98 %  11/03/15 1000 101/61 98.6 F (37 C) Oral 80 18 95 %  11/03/15 0540 (!) 80/49 97.9 F (36.6 C) Oral 69 18 97 %  11/03/15 0220 (!) 94/52 - - 79 - -  11/03/15 0119 (!) 82/44 98.4 F (36.9 C) Oral 72 20 97 %  11/02/15 2128 (!) 105/58 99.3 F (37.4 C) Oral 97 18 99 %  11/02/15 1900 (!) 94/59 - - - - -  11/02/15 1721 (!) 87/45 98.3 F (36.8 C) Oral 86 19 98 %  Last fever: 0538 on 9/5 38.8  JP: 10 & 15 output NAD  C/d/i with minimal slightly yellow/brown fluid in JP drains PICO in place and functioning  CBC Latest Ref Rng & Units 11/03/2015 11/01/2015 10/31/2015  WBC 4.0 - 10.5 K/uL 14.3(H) 26.5(H) 23.8(H)  Hemoglobin 12.0 - 15.0 g/dL 7.3(L) 8.4(L) 9.1(L)  Hematocrit 36.0 - 46.0 % 22.3(L) 24.5(L) 26.5(L)  Platelets 150 - 400 K/uL 425(H) 491(H) 509(H)   Pending: -Aerobic culture: NGTD but getting reincubated for better growth; so far with few WBC and few GPC in pairs and rare gram variable rods -anaerobic -BCx: NGTD  A/p: pt doing well Continue current care. D/w pt that likely will keep her on broad spec abx until cultures are back and can d/w IDthen  Cornelia Copaharlie Ramiel Forti, Jr MD Attending Center for [facility](Faculty Practice)

## 2015-11-03 NOTE — Progress Notes (Signed)
Late entry : pt seen this am  Subjective: Postpartum Day #13 s/p [facility]arean Delivery @ [facility].  Pt now with Day #8 of IV atbx.  Patient reports + flatus.  She is tolerating reg diet. Her mouth feel better.  She is having pain now but, it has been well controlled.  Objective: Vital signs in last 24 hours: BP 98/60 (BP Location: Right Arm)   Pulse 90   Temp 98.8 F (37.1 C)   Resp 20   Ht 5\' 2"  (1.575 m)   Wt 188 lb 8 oz (85.5 kg)   SpO2 98%   BMI 34.48 kg/m   Physical Exam:  General: pt was initially asleep but, upon awakening she became teary   Lungs: CTA CV: RRR Abd; +BS; diffusely tender; no rebound; the PICO is in place and the dressing is dry. The perc drains cont to drain but the fluid is greatly decreased and the color is lighter. Uterine Fundus: firm DVT Evaluation: No evidence of DVT seen on physical exam. CBC    Component Value Date/Time   WBC 14.3 (H) 11/03/2015 0928   RBC 2.44 (L) 11/03/2015 0928   HGB 7.3 (L) 11/03/2015 0928   HCT 22.3 (L) 11/03/2015 0928   PLT 425 (H) 11/03/2015 0928   MCV 91.4 11/03/2015 0928   MCH 29.9 11/03/2015 0928   MCHC 32.7 11/03/2015 0928   RDW 14.6 11/03/2015 0928   LYMPHSABS 1.9 11/01/2015 0508   MONOABS 1.4 (H) 11/01/2015 0508   EOSABS 0.1 11/01/2015 0508   BASOSABS 0.1 11/01/2015 0508    Assessment/Plan: POD# 13 s/p primary c-section. POD #1 s/p I&D of subcutaneous wound abscess.  Wound vac in place. Post op pelvic abscess  S/p admission for IV Atbx   S/p perc drain placement on POD# 10 Oral thrush- imporved Aflutter- stable on lopressor    ID:  Pelvic abscess Blood cx- no growth X 3 days Repeat CBC in am cont Imipenem and Vancomycin day #8 atbx/ Day #4 current regimen  rec repeat pelvic/abd CT in 1-2 days to check progression of abscess Anemia: Stable. Will follow.   Cardiac: [facility] Pt s/p cardiology consult 10/29/2015. Thought due to underlying infection.Will cont Lopressor and observe. Stable at present  DVT prophylaxis: Lovenox 40mg  Iv daily   Oral thrush- will d/c Nystatin   Raven [name] 11/03/2015, 10:32 AM

## 2015-11-04 ENCOUNTER — Encounter (HOSPITAL_COMMUNITY): Payer: Self-pay

## 2015-11-04 ENCOUNTER — Inpatient Hospital Stay (HOSPITAL_COMMUNITY): Payer: Medicaid Other

## 2015-11-04 LAB — CBC WITH DIFFERENTIAL/PLATELET
Basophils Absolute: 0.1 10*3/uL (ref 0.0–0.1)
Basophils Relative: 1 %
EOS PCT: 2 %
Eosinophils Absolute: 0.3 10*3/uL (ref 0.0–0.7)
HCT: 22.2 % — ABNORMAL LOW (ref 36.0–46.0)
Hemoglobin: 7.5 g/dL — ABNORMAL LOW (ref 12.0–15.0)
LYMPHS ABS: 3 10*3/uL (ref 0.7–4.0)
Lymphocytes Relative: 21 %
MCH: 30.7 pg (ref 26.0–34.0)
MCHC: 33.8 g/dL (ref 30.0–36.0)
MCV: 91 fL (ref 78.0–100.0)
MONO ABS: 0.4 10*3/uL (ref 0.1–1.0)
Monocytes Relative: 3 %
Neutro Abs: 10.4 10*3/uL — ABNORMAL HIGH (ref 1.7–7.7)
Neutrophils Relative %: 73 %
PLATELETS: 515 10*3/uL — AB (ref 150–400)
RBC: 2.44 MIL/uL — AB (ref 3.87–5.11)
RDW: 14.5 % (ref 11.5–15.5)
WBC: 14.2 10*3/uL — AB (ref 4.0–10.5)

## 2015-11-04 LAB — TYPE AND SCREEN
ABO/RH(D): O POS
Antibody Screen: NEGATIVE
UNIT DIVISION: 0
UNIT DIVISION: 0
UNIT DIVISION: 0
Unit division: 0

## 2015-11-04 LAB — CREATININE, SERUM
Creatinine, Ser: 0.49 mg/dL (ref 0.44–1.00)
GFR calc non Af Amer: >60 mL/min (ref >60)

## 2015-11-04 MED ORDER — DIATRIZOATE MEGLUMINE & SODIUM 66-10 % PO SOLN
30.0000 mL | Freq: Once | ORAL | Status: AC
  Administered 2015-11-04: 30 mL via ORAL
  Filled 2015-11-04: qty 30

## 2015-11-04 MED ORDER — IBUPROFEN 600 MG PO TABS
600.0000 mg | ORAL_TABLET | Freq: Four times a day (QID) | ORAL | Status: DC | PRN
  Administered 2015-11-04 – 2015-11-08 (×8): 600 mg via ORAL
  Filled 2015-11-04 (×9): qty 1

## 2015-11-04 MED ORDER — IOPAMIDOL (ISOVUE-300) INJECTION 61%
100.0000 mL | Freq: Once | INTRAVENOUS | Status: AC | PRN
  Administered 2015-11-04: 100 mL via INTRAVENOUS

## 2015-11-04 MED ORDER — HYDROMORPHONE HCL 2 MG/ML IJ SOLN
2.0000 mg | Freq: Once | INTRAMUSCULAR | Status: AC
  Administered 2015-11-04: 2 mg via INTRAVENOUS
  Filled 2015-11-04: qty 1

## 2015-11-04 NOTE — Progress Notes (Signed)
Serum Creatinine this am was 0.49mg /dL.  Continue current Vancomycin regimen.    Hurley CiscoMendenhall, Jency Schnieders D , Pharm.D.

## 2015-11-04 NOTE — Progress Notes (Signed)
Patient ID: Raven [name], female   DOB: Jul 20, 1995, 20 y.o.   MRN: [id number] Subjective: Postpartum Day #14 s/p [facility] Delivery @ [facility]. Pt now with Day #9 of IV atbx.  Patient reports + flatus. She is tolerating reg diet. She is having pain now but, it has been well controlled.  She is still requesting Diluadid.  She is passing gas and has had a BM.  Objective: BP 113/71 (BP Location: Right Arm)   Pulse 76   Temp 97.5 F (36.4 C) (Oral)   Resp 18   Ht 5\' 2"  (1.575 m)   Wt 188 lb 8 oz (85.5 kg)   SpO2 100%   BMI 34.48 kg/m  I/O last 3 completed shifts: In: 4732.1 [P.O.:3580; I.V.:52.1; IV Piggyback:1100] Out: 5799.5 [Urine:5725; Drains:74.5] Total I/O In: 240 [P.O.:240] Out: -    Physical Exam: General: pt was initially asleep but, upon awakening she became teary  Lungs: CTA CV: RRR Abd; +BS; decreased tenderness over prev exams; no rebound; the PICO is in place and the dressing is dry.The perc drains cont to drain but the fluid is almost clear and the amount is min. Uterine Fundus: firm.  Th eskin over the abd is still erythematous.  Still some induration on the left side  DVT Evaluation: No evidence of DVT seen on physical exam.  CBC    Component Value Date/Time   WBC 14.2 (H) 11/04/2015 0522   RBC 2.44 (L) 11/04/2015 0522   HGB 7.5 (L) 11/04/2015 0522   HCT 22.2 (L) 11/04/2015 0522   PLT 515 (H) 11/04/2015 0522   MCV 91.0 11/04/2015 0522   MCH 30.7 11/04/2015 0522   MCHC 33.8 11/04/2015 0522   RDW 14.5 11/04/2015 0522   LYMPHSABS 3.0 11/04/2015 0522   MONOABS 0.4 11/04/2015 0522   EOSABS 0.3 11/04/2015 0522   BASOSABS 0.1 11/04/2015 0522      Assessment/Plan: POD# 14s/p primary c-section. POD #2 s/p I&D of subcutaneous wound abscess.  Wound vac in place. Post op pelvic abscess  S/p admission for IV Atbx  S/p perc drain placement on POD# 10 Oral thrush- imporved Aflutter- stable on lopressor    ID:  Pelvic  abscess Blood cx- no growth X 3 days Repeat CBC in am cont Imipenem and Vancomycin day #9 atbx/ Day #5 current regimen  rec repeat pelvic/abd CT today  to check progression of abscess Anemia: Stable. Will follow.   Cardiac[place] [facility] [place] [place] [facility] Pt s/p cardiology consult 10/29/2015. Thought due to underlying infection.Will cont Lopressor and observe. Stable at present  DVT prophylaxis: Lovenox 40mg  Iv daily   Oral thrush- will d/c Nystatin

## 2015-11-04 NOTE — Progress Notes (Signed)
OB Attending  CT scan results from today reviewed with Interventional Radiology. Fluid collection on right side appears to at incision line.  JP drains removed. PICO removed and skin clips removed. About 250 cc of serious fluid expressed from right side of incision.  Wound probed with sterile Q tip. Extends across the length of the incision, about 3-4 cm cephalad  And 2-3 cm to the right conner of the incision.  Tissue has good blood supply. Wound was irrigated with half H2O2 and sterile H2O2 and then packed with plain sterile packing gauze. ABD pad and secured with tape.  Pt tolerated well. Received 2 mg Dilaudid IV during procedure.

## 2015-11-05 LAB — CBC
HEMATOCRIT: 23.9 % — AB (ref 36.0–46.0)
Hemoglobin: 8.1 g/dL — ABNORMAL LOW (ref 12.0–15.0)
MCH: 30.8 pg (ref 26.0–34.0)
MCHC: 33.9 g/dL (ref 30.0–36.0)
MCV: 90.9 fL (ref 78.0–100.0)
Platelets: 486 10*3/uL — ABNORMAL HIGH (ref 150–400)
RBC: 2.63 MIL/uL — AB (ref 3.87–5.11)
RDW: 14.5 % (ref 11.5–15.5)
WBC: 12.4 10*3/uL — AB (ref 4.0–10.5)

## 2015-11-05 LAB — CULTURE, BLOOD (ROUTINE X 2): Culture: NO GROWTH

## 2015-11-05 MED ORDER — MORPHINE SULFATE (PF) 4 MG/ML IV SOLN
2.0000 mg | Freq: Once | INTRAVENOUS | Status: AC
  Administered 2015-11-05: 2 mg via INTRAVENOUS
  Filled 2015-11-05: qty 1

## 2015-11-05 MED ORDER — OXYCODONE HCL 5 MG PO TABS
5.0000 mg | ORAL_TABLET | Freq: Four times a day (QID) | ORAL | Status: DC | PRN
  Administered 2015-11-05 – 2015-11-06 (×3): 5 mg via ORAL
  Filled 2015-11-05 (×4): qty 1

## 2015-11-05 MED ORDER — HYDROMORPHONE HCL 1 MG/ML IJ SOLN
1.0000 mg | INTRAMUSCULAR | Status: DC | PRN
  Administered 2015-11-05 (×2): 2 mg via INTRAVENOUS
  Administered 2015-11-05: 1 mg via INTRAVENOUS
  Administered 2015-11-06 (×2): 2 mg via INTRAVENOUS
  Filled 2015-11-05 (×5): qty 2
  Filled 2015-11-05: qty 1

## 2015-11-05 NOTE — Progress Notes (Signed)
Patient ID: Raven MottsKristian [name], female   DOB: 03/13/1995, 20 y.o.   MRN: [id number]   Subjective: Interval History: incision further opened yesterday with removal of a large amount of pus. Remains afebrile. Unhappy with the level of pain she is having. Oxy IR are not enough--dilaudid worked much better.  Objective: Vital signs in last 24 hours: Temp:  [97.6 F (36.4 C)-99.8 F (37.7 C)] 97.9 F (36.6 C) (09/08 1057) Pulse Rate:  [54-94] 54 (09/08 1057) Resp:  [17-19] 18 (09/08 1057) BP: (102-116)/(52-76) 116/75 (09/08 1057) SpO2:  [96 %-99 %] 96 % (09/08 1057)  Intake/Output from previous day: 09/07 0701 - 09/08 0700 In: 1411.3 [P.O.:480; I.V.:931.3] Out: 2360 [Urine:2350; Drains:10] Intake/Output this shift: No intake/output data recorded.  General appearance: alert, cooperative and appears stated age Abdomen: soft and tender Skin: large amount of induation superior to the incision  Wound: no erythema, packing removed and replaced with H2O2 and sterile water.  Results for orders placed or performed during the hospital encounter of 10/31/15 (from the past 24 hour(s))  CBC     Status: Abnormal   Collection Time: 11/05/15  6:18 AM  Result Value Ref Range   WBC 12.4 (H) 4.0 - 10.5 K/uL   RBC 2.63 (L) 3.87 - 5.11 MIL/uL   Hemoglobin 8.1 (L) 12.0 - 15.0 g/dL   HCT 40.923.9 (L) 81.136.0 - 91.446.0 %   MCV 90.9 78.0 - 100.0 fL   MCH 30.8 26.0 - 34.0 pg   MCHC 33.9 30.0 - 36.0 g/dL   RDW 78.214.5 95.611.5 - 21.315.5 %   Platelets 486 (H) 150 - 400 K/uL    Studies/Results: Ct Pelvis W Contrast  Result Date: 11/04/2015 CLINICAL DATA:  Followup abscess drainage EXAM: CT PELVIS WITH CONTRAST TECHNIQUE: Multidetector CT imaging of the pelvis was performed using the standard protocol following the bolus administration of intravenous contrast. CONTRAST:  100mL ISOVUE-300 IOPAMIDOL (ISOVUE-300) INJECTION 61% COMPARISON:  10/31/2015 FINDINGS: The tube pelvic abscess drains remain in place. The abscess collections  have resolved is other than a small rim of fluid about the right side of the uterus. Right lower quadrant subcutaneous abscess measures 8.2 x 3.6 cm and previously measured 9.1 x 3.5 cm. This is slightly improved. Left lower quadrant subcutaneous abscess measures 2.5 x 1.8 cm and previously measured 3.1 x 2.1 cm. This has improved. There is no new abscess. Bladder is mildly distended. Overlying staples remain in place. IMPRESSION: The intrapelvic abscess drains remain in place and the abscess collections have virtually resolved. If output is minimal, the drains can be removed. Right lower quadrant and left lower quadrant subcutaneous abscesses are improved as described. The right lower quadrant subcutaneous absence remains sizable at 8.2 x 3.6 cm. Electronically Signed   By: Jolaine ClickArthur  Hoss M.D.   On: 11/04/2015 15:09   Ct Abdomen Pelvis W Contrast  Result Date: 10/31/2015 CLINICAL DATA:  20 year old female with recurrent pelvic abscesses following C-section. EXAM: CT ABDOMEN AND PELVIS WITH CONTRAST TECHNIQUE: Multidetector CT imaging of the abdomen and pelvis was performed using the standard protocol following bolus administration of intravenous contrast. CONTRAST:  100mL ISOVUE-300 IOPAMIDOL (ISOVUE-300) INJECTION 61% COMPARISON:  10[facility]28/2015 [facility] FINDINGS: Lower chest:  Unremarkable Hepatobiliary: The liver and gallbladder are unremarkable. Pancreas: Unremarkable Spleen: Unremarkable Adrenals/Urinary Tract: Fullness of the intrarenal collecting systems noted. The kidneys, adrenal glands are otherwise unremarkable. Gallbladder wall thickening identified. Stomach/Bowel: No bowel obstruction or definite bowel wall thickening. Vascular/Lymphatic: No enlarged lymph nodes or abdominal aortic  aneurysm. Reproductive: Gas within the endometrial canal has decreased. The uterus is enlarged compatible with recent postpartum state. Two pelvic drains are identified with a tiny amount of residual  fluid along the drain tips. A small amount of ill-defined residual fluid and gas adjacent to the uterus noted. A 3.5 x 9 cm anterior right lower pelvic subcutaneous abscess and a 2 x 3.1 cm anterior left lower pelvic subcutaneous abscess have now performed. Other: No free fluid. Musculoskeletal: No acute bony abnormality. IMPRESSION: New anterior subcutaneous lower pelvic abscesses measuring 3.5 x 9 cm on the right and 2 x 3.1 cm on the left. Significant decrease of intrapelvic abscesses with tiny amount of residual fluid along the 2 percutaneous drain tips. Small amount of ill-defined residual fluid and gas adjacent to the uterus still present. Fullness of bilateral intrarenal collecting systems. Electronically Signed   By: Harmon Pier M.D.   On: 10/31/2015 17:41    Scheduled Meds: . enoxaparin (LOVENOX) injection  40 mg Subcutaneous Q24H  . imipenem-cilastatin  1,000 mg Intravenous Q8H  . metoprolol tartrate  25 mg Oral BID  . nystatin  5 mL Oral QID  . vancomycin  1,000 mg Intravenous Q8H   Continuous Infusions: . dextrose 5 % and 0.45% NaCl 125 mL/hr at 11/05/15 1115   PRN Meds:acetaminophen **OR** acetaminophen, HYDROmorphone (DILAUDID) injection, ibuprofen, ondansetron (ZOFRAN) IV, oxyCODONE, phenol, zolpidem  Assessment/Plan: Patient Active Problem List   Diagnosis Date Noted  . Subcutaneous abscess 11/02/2015  . Postpartum pelvic abscess 10/31/2015  . Pelvic abscess in female 10/31/2015  . Post-operative state 10/31/2015  . Hx of atrial flutter 10/31/2015   Continue inpatient management and IV abx. Pain control--add back Dilaudid prn Wet-->dry dressing changes. Consider HHN for wound changes   LOS: 5 days   Reva Bores, MD 11/05/2015 12:24 PM

## 2015-11-05 NOTE — Care Management Note (Signed)
Case Management Note  Patient Details  Name: Raven [name] MRN: [id number] Date of Birth: [birth year]  Subjective/Objective:    20 year old female admitted 10/31/15 S/P I & D of subcutaneous wound abscess/post op pelvic abscess                Action/Plan:D/C when medically stable.   HH Arranged:   CM received HH orders and met with pt to offer choice for [facility] services.  Pt with no preference so Raven [name] at [facility] called with orders and confirmation received-they will follow through the weekend for possible d/c.  [phone].  Also, pt with furniture needs.  Pt given number to call [facility] for possible furniture availability (sofa)  Raven [name] RNC-MNN, BSN 11/05/2015, 2:33 PM

## 2015-11-06 LAB — CBC WITH DIFFERENTIAL/PLATELET
BAND NEUTROPHILS: 0 %
BASOS ABS: 0 10*3/uL (ref 0.0–0.1)
BLASTS: 0 %
Basophils Relative: 0 %
Eosinophils Absolute: 0 10*3/uL (ref 0.0–0.7)
Eosinophils Relative: 0 %
HEMATOCRIT: 25.4 % — AB (ref 36.0–46.0)
Hemoglobin: 8.4 g/dL — ABNORMAL LOW (ref 12.0–15.0)
Lymphocytes Relative: 11 %
Lymphs Abs: 1.5 10*3/uL (ref 0.7–4.0)
MCH: 30.5 pg (ref 26.0–34.0)
MCHC: 33.1 g/dL (ref 30.0–36.0)
MCV: 92.4 fL (ref 78.0–100.0)
METAMYELOCYTES PCT: 0 %
Monocytes Absolute: 0.1 10*3/uL (ref 0.1–1.0)
Monocytes Relative: 1 %
Myelocytes: 0 %
Neutro Abs: 11.8 10*3/uL — ABNORMAL HIGH (ref 1.7–7.7)
Neutrophils Relative %: 88 %
Other: 0 %
PROMYELOCYTES ABS: 0 %
Platelets: 513 10*3/uL — ABNORMAL HIGH (ref 150–400)
RBC: 2.75 MIL/uL — AB (ref 3.87–5.11)
RDW: 14.5 % (ref 11.5–15.5)
WBC: 13.4 10*3/uL — AB (ref 4.0–10.5)
nRBC: 0 /100 WBC

## 2015-11-06 MED ORDER — HYDROMORPHONE HCL 2 MG PO TABS
2.0000 mg | ORAL_TABLET | Freq: Four times a day (QID) | ORAL | Status: DC | PRN
  Administered 2015-11-06 (×2): 2 mg via ORAL
  Administered 2015-11-06 – 2015-11-07 (×3): 4 mg via ORAL
  Administered 2015-11-08: 2 mg via ORAL
  Administered 2015-11-08: 4 mg via ORAL
  Administered 2015-11-08: 2 mg via ORAL
  Filled 2015-11-06 (×2): qty 2
  Filled 2015-11-06 (×3): qty 1
  Filled 2015-11-06: qty 2
  Filled 2015-11-06: qty 1
  Filled 2015-11-06: qty 2
  Filled 2015-11-06: qty 1

## 2015-11-06 MED ORDER — ONDANSETRON 4 MG PO TBDP
4.0000 mg | ORAL_TABLET | Freq: Four times a day (QID) | ORAL | Status: DC | PRN
  Administered 2015-11-06 – 2015-11-07 (×2): 4 mg via ORAL
  Filled 2015-11-06 (×3): qty 1

## 2015-11-06 MED ORDER — METRONIDAZOLE 500 MG PO TABS
500.0000 mg | ORAL_TABLET | Freq: Three times a day (TID) | ORAL | Status: DC
  Administered 2015-11-06 – 2015-11-08 (×6): 500 mg via ORAL
  Filled 2015-11-06 (×10): qty 1

## 2015-11-06 MED ORDER — LEVOFLOXACIN 750 MG PO TABS
750.0000 mg | ORAL_TABLET | Freq: Every day | ORAL | Status: DC
  Administered 2015-11-06 – 2015-11-08 (×3): 750 mg via ORAL
  Filled 2015-11-06 (×4): qty 1

## 2015-11-06 NOTE — Progress Notes (Signed)
Patient has been saline locked since 9/5

## 2015-11-06 NOTE — Progress Notes (Addendum)
D/w ID and recommend narrowing to unasyn (per pharmacy recs) given cultures that have grown here and at [facility]. Given 8cm SQ abscess, I'd like to keep her until Monday and see if IR can u/s and drain that abscess and if those cultures grown anything different, re-discuss with ID but if c/w prior cultures than can transition to Augmentin, if continues to be afebrile and clinically improving.  Raven [name], Raven [name] Attending Center for [facility](Faculty Practice)   Addendum Due to PCN allergy (type 1) will do levaquin and flagyl per ID recs, both PO  Raven [name], Raven [name] Attending Center for [facility](Faculty Practice)

## 2015-11-06 NOTE — Progress Notes (Signed)
T/c Dr. Vergie LivingPickens:  Request for zofran and dilaudid to be converted to PO; and lopressor held due to heart rate.

## 2015-11-06 NOTE — Progress Notes (Addendum)
OB note  I called Dr. Donavan FoilBass (covering MD for [place]) to f/u on her cultures from [place]  BCx x 2 with no growth UCx: negative  LLQ IR drainage: peptoniphilius asaccarolyticus, anaerococcus tetradius, prevotella melaninogenica RLQ: same as above  No susceptibilities done due to anaerobes on culture.    Will try and d/w ID re: abx regimen but patient still appears to have RLQ 8cm SQ fluid collection.   Cornelia Copaharlie Charlaine Utsey, Jr MD Attending Center for [facility](Faculty Practice)

## 2015-11-06 NOTE — Progress Notes (Signed)
Subjective: Pt wound being dressed daily. Pt has low pain tolerances Patient reports incisional pain.  No fever  Objective: I have reviewed patient's vital signs, intake and output, medications and labs.  General: alert, cooperative and distracted Cardio: regular rate and rhythm GI: incision: indurated and packing removed and clots in base of defect extracted wtih wet-dry dressing.  CBC Latest Ref Rng & Units 11/05/2015 11/04/2015 11/03/2015  WBC 4.0 - 10.5 K/uL 12.4(H) 14.2(H) 14.3(H)  Hemoglobin 12.0 - 15.0 g/dL 8.1(L) 7.5(L) 7.3(L)  Hematocrit 36.0 - 46.0 % 23.9(L) 22.2(L) 22.3(L)  Platelets 150 - 400 K/uL 486(H) 515(H) 425(H)   wound culture + for "RARE KLEBSIELLA PNEUMONIAE"     Assessment/Plan: POSTOP WOUND INFECTION/ ABSCESSES. RESPONDING TO THERAPY. WILL KEEP TIL AM, AND [facility] F[facility]   [facility]  [phone] NOTIFIED OF HER PENDING D/C  LOS: 6 days    Diania Co V 11/06/2015, 9:57 AM

## 2015-11-07 ENCOUNTER — Inpatient Hospital Stay (HOSPITAL_COMMUNITY): Payer: Medicaid Other | Admitting: Anesthesiology

## 2015-11-07 ENCOUNTER — Inpatient Hospital Stay (HOSPITAL_COMMUNITY): Payer: Medicaid Other

## 2015-11-07 ENCOUNTER — Encounter (HOSPITAL_COMMUNITY)
Admission: AD | Disposition: A | Discharge status: Home / Self Care | Payer: Self-pay | Source: Ambulatory Visit | Attending: Obstetrics & Gynecology

## 2015-11-07 HISTORY — PX: INCISION AND DRAINAGE ABSCESS: SHX5864

## 2015-11-07 LAB — CBC WITH DIFFERENTIAL/PLATELET
BLASTS: 0 %
Band Neutrophils: 0 %
Basophils Absolute: 0 10*3/uL (ref 0.0–0.1)
Basophils Relative: 0 %
Eosinophils Absolute: 0 10*3/uL (ref 0.0–0.7)
Eosinophils Relative: 0 %
HCT: 25.1 % — ABNORMAL LOW (ref 36.0–46.0)
HEMOGLOBIN: 8.2 g/dL — AB (ref 12.0–15.0)
Lymphocytes Relative: 13 %
Lymphs Abs: 1.5 10*3/uL (ref 0.7–4.0)
MCH: 30.4 pg (ref 26.0–34.0)
MCHC: 32.7 g/dL (ref 30.0–36.0)
MCV: 93 fL (ref 78.0–100.0)
MONO ABS: 0.3 10*3/uL (ref 0.1–1.0)
Metamyelocytes Relative: 0 %
Monocytes Relative: 3 %
Myelocytes: 0 %
NRBC: 0 /100{WBCs}
Neutro Abs: 9.6 10*3/uL — ABNORMAL HIGH (ref 1.7–7.7)
Neutrophils Relative %: 84 %
Other: 0 %
PROMYELOCYTES ABS: 0 %
Platelets: 508 10*3/uL — ABNORMAL HIGH (ref 150–400)
RBC: 2.7 MIL/uL — AB (ref 3.87–5.11)
RDW: 14.7 % (ref 11.5–15.5)
WBC: 11.4 10*3/uL — AB (ref 4.0–10.5)

## 2015-11-07 LAB — MRSA PCR SCREENING: MRSA by PCR: NEGATIVE

## 2015-11-07 LAB — AEROBIC CULTURE  (SUPERFICIAL SPECIMEN)

## 2015-11-07 LAB — AEROBIC CULTURE W GRAM STAIN (SUPERFICIAL SPECIMEN)

## 2015-11-07 LAB — CREATININE, SERUM: CREATININE: 0.62 mg/dL (ref 0.44–1.00)

## 2015-11-07 SURGERY — INCISION AND DRAINAGE, ABSCESS
Anesthesia: General | Site: Abdomen | Laterality: Bilateral

## 2015-11-07 MED ORDER — HYDROMORPHONE HCL 1 MG/ML IJ SOLN: 1.0000 mg | Freq: Once | INTRAMUSCULAR | Status: DC

## 2015-11-07 MED ORDER — SODIUM CHLORIDE 0.9 % IV SOLN
INTRAVENOUS | Status: DC | PRN
  Administered 2015-11-07: 20:00:00 via INTRAVENOUS

## 2015-11-07 MED ORDER — HYDROMORPHONE HCL 1 MG/ML IJ SOLN
INTRAMUSCULAR | Status: AC
  Administered 2015-11-07: 0.5 mg via INTRAVENOUS
  Filled 2015-11-07: qty 1

## 2015-11-07 MED ORDER — LABETALOL HCL 5 MG/ML IV SOLN
15.0000 mg | Freq: Once | INTRAVENOUS | Status: AC
  Administered 2015-11-07: 15 mg via INTRAVENOUS

## 2015-11-07 MED ORDER — LACTATED RINGERS IV SOLN
INTRAVENOUS | Status: DC | PRN
  Administered 2015-11-07: 21:00:00 via INTRAVENOUS

## 2015-11-07 MED ORDER — MIDAZOLAM HCL 2 MG/2ML IJ SOLN
INTRAMUSCULAR | Status: AC
  Filled 2015-11-07: qty 2

## 2015-11-07 MED ORDER — BUPIVACAINE HCL (PF) 0.25 % IJ SOLN
INTRAMUSCULAR | Status: AC
  Filled 2015-11-07: qty 30

## 2015-11-07 MED ORDER — METRONIDAZOLE IN NACL 5-0.79 MG/ML-% IV SOLN
INTRAVENOUS | Status: DC | PRN
  Administered 2015-11-07: 500 mg via INTRAVENOUS

## 2015-11-07 MED ORDER — SODIUM CHLORIDE 0.9 % IV SOLN
INTRAVENOUS | Status: DC
  Administered 2015-11-07: 16:00:00 via INTRAVENOUS

## 2015-11-07 MED ORDER — PROMETHAZINE HCL 25 MG/ML IJ SOLN: 6.2500 mg | INTRAMUSCULAR | Status: DC | PRN

## 2015-11-07 MED ORDER — ONDANSETRON HCL 4 MG/2ML IJ SOLN
INTRAMUSCULAR | Status: DC | PRN
  Administered 2015-11-07: 4 mg via INTRAVENOUS

## 2015-11-07 MED ORDER — SUCCINYLCHOLINE CHLORIDE 200 MG/10ML IV SOSY
PREFILLED_SYRINGE | INTRAVENOUS | Status: DC | PRN
  Administered 2015-11-07: 120 mg via INTRAVENOUS

## 2015-11-07 MED ORDER — LABETALOL HCL 5 MG/ML IV SOLN
INTRAVENOUS | Status: AC
  Filled 2015-11-07: qty 4

## 2015-11-07 MED ORDER — BUPIVACAINE-EPINEPHRINE 0.25% -1:200000 IJ SOLN
INTRAMUSCULAR | Status: DC | PRN
  Administered 2015-11-07: 30 mL

## 2015-11-07 MED ORDER — FENTANYL CITRATE (PF) 250 MCG/5ML IJ SOLN
INTRAMUSCULAR | Status: DC | PRN
  Administered 2015-11-07: 50 ug via INTRAVENOUS
  Administered 2015-11-07 (×2): 100 ug via INTRAVENOUS

## 2015-11-07 MED ORDER — SUCCINYLCHOLINE CHLORIDE 200 MG/10ML IV SOSY
PREFILLED_SYRINGE | INTRAVENOUS | Status: AC
Start: 2015-11-07 — End: 2015-11-07
  Filled 2015-11-07: qty 10

## 2015-11-07 MED ORDER — FENTANYL CITRATE (PF) 250 MCG/5ML IJ SOLN
INTRAMUSCULAR | Status: AC
  Filled 2015-11-07: qty 5

## 2015-11-07 MED ORDER — LIDOCAINE HCL (CARDIAC) 20 MG/ML IV SOLN
INTRAVENOUS | Status: AC
Start: 2015-11-07 — End: 2015-11-07
  Filled 2015-11-07: qty 5

## 2015-11-07 MED ORDER — MEPERIDINE HCL 25 MG/ML IJ SOLN: 6.2500 mg | INTRAMUSCULAR | Status: DC | PRN

## 2015-11-07 MED ORDER — HYDROMORPHONE HCL 1 MG/ML IJ SOLN
INTRAMUSCULAR | Status: AC
  Filled 2015-11-07: qty 1

## 2015-11-07 MED ORDER — HYDROMORPHONE HCL 1 MG/ML IJ SOLN
0.2500 mg | INTRAMUSCULAR | Status: DC | PRN
  Administered 2015-11-07 (×4): 0.5 mg via INTRAVENOUS

## 2015-11-07 MED ORDER — HYDRALAZINE HCL 20 MG/ML IJ SOLN
INTRAMUSCULAR | Status: AC
  Administered 2015-11-07: 10 mg via INTRAVENOUS
  Filled 2015-11-07: qty 1

## 2015-11-07 MED ORDER — METRONIDAZOLE IN NACL 5-0.79 MG/ML-% IV SOLN
500.0000 mg | Freq: Once | INTRAVENOUS | Status: DC
  Filled 2015-11-07: qty 100

## 2015-11-07 MED ORDER — LABETALOL HCL 200 MG PO TABS
400.0000 mg | ORAL_TABLET | Freq: Two times a day (BID) | ORAL | Status: DC
  Filled 2015-11-07 (×3): qty 2

## 2015-11-07 MED ORDER — NEOSTIGMINE METHYLSULFATE 10 MG/10ML IV SOLN
INTRAVENOUS | Status: AC
  Filled 2015-11-07: qty 1

## 2015-11-07 MED ORDER — PROPOFOL 10 MG/ML IV BOLUS
INTRAVENOUS | Status: AC
  Filled 2015-11-07: qty 20

## 2015-11-07 MED ORDER — MIDAZOLAM HCL 5 MG/5ML IJ SOLN
INTRAMUSCULAR | Status: DC | PRN
  Administered 2015-11-07: 2 mg via INTRAVENOUS

## 2015-11-07 MED ORDER — DEXAMETHASONE SODIUM PHOSPHATE 10 MG/ML IJ SOLN
INTRAMUSCULAR | Status: AC
  Filled 2015-11-07: qty 1

## 2015-11-07 MED ORDER — DEXAMETHASONE SODIUM PHOSPHATE 10 MG/ML IJ SOLN
INTRAMUSCULAR | Status: DC | PRN
  Administered 2015-11-07: 10 mg via INTRAVENOUS

## 2015-11-07 MED ORDER — GLYCOPYRROLATE 0.2 MG/ML IJ SOLN
INTRAMUSCULAR | Status: DC | PRN
  Administered 2015-11-07 (×2): 0.2 mg via INTRAVENOUS

## 2015-11-07 MED ORDER — OXYCODONE HCL 5 MG PO TABS
ORAL_TABLET | ORAL | Status: AC
  Administered 2015-11-07: 10 mg via ORAL
  Filled 2015-11-07: qty 2

## 2015-11-07 MED ORDER — LIDOCAINE HCL (CARDIAC) 20 MG/ML IV SOLN
INTRAVENOUS | Status: DC | PRN
  Administered 2015-11-07: 40 mg via INTRAVENOUS

## 2015-11-07 MED ORDER — ONDANSETRON HCL 4 MG/2ML IJ SOLN
INTRAMUSCULAR | Status: AC
  Filled 2015-11-07: qty 2

## 2015-11-07 MED ORDER — PROPOFOL 10 MG/ML IV BOLUS
INTRAVENOUS | Status: DC | PRN
  Administered 2015-11-07: 150 mg via INTRAVENOUS

## 2015-11-07 MED ORDER — LABETALOL HCL 5 MG/ML IV SOLN
5.0000 mg | Freq: Once | INTRAVENOUS | Status: AC
  Administered 2015-11-07: 5 mg via INTRAVENOUS

## 2015-11-07 MED ORDER — GLYCOPYRROLATE 0.2 MG/ML IJ SOLN
INTRAMUSCULAR | Status: AC
  Filled 2015-11-07: qty 4

## 2015-11-07 MED ORDER — BACITRACIN ZINC 500 UNIT/GM EX OINT
TOPICAL_OINTMENT | CUTANEOUS | Status: AC
  Filled 2015-11-07: qty 28.35

## 2015-11-07 MED ORDER — LABETALOL HCL 5 MG/ML IV SOLN
10.0000 mg | Freq: Once | INTRAVENOUS | Status: AC
  Administered 2015-11-07: 10 mg via INTRAVENOUS

## 2015-11-07 SURGICAL SUPPLY — 7 items
NEEDLE HYPO 22GX1.5 SAFETY (NEEDLE) ×3 IMPLANT
NS IRRIG 1000ML POUR BTL (IV SOLUTION) ×3 IMPLANT
PACK ABDOMINAL GYN (CUSTOM PROCEDURE TRAY) ×3 IMPLANT
PAD ABD 8X7 1/2 STERILE (GAUZE/BANDAGES/DRESSINGS) ×6 IMPLANT
SPONGE GAUZE 4X4 12PLY STER LF (GAUZE/BANDAGES/DRESSINGS) ×3 IMPLANT
SYR CONTROL 10ML LL (SYRINGE) ×3 IMPLANT
TRAY FOLEY BAG SILVER LF 14FR (CATHETERS) ×3 IMPLANT

## 2015-11-07 NOTE — Progress Notes (Signed)
1933 BP=159/98  PR=48 : Dr. Emelda FearFerguson updated , Lopressor 25 mgs PO held this morning @ 0945. Advised by this MD not to give Lopressor at this time as patient to OR @ 2000.

## 2015-11-07 NOTE — Op Note (Addendum)
10/31/2015 - 11/07/2015  9:32 PM  PATIENT:  Raven [name]  20 y.o. female  PRE-OPERATIVE DIAGNOSIS:  C-Section wound abcess,  POST-OPERATIVE DIAGNOSIS:  infected wound hematoma  PROCEDURE:  Procedure(s) with comments: INCISION AND DRAINAGE ABSCESS (Bilateral) - c/section incision site Incision and drainage of JP drain tract. SURGEON:  Surgeon(s) and Role:    * Tilda Burrow, MD - Primary  PHYSICIAN ASSISTANT:   ASSISTANTS: none   ANESTHESIA:   general  EBL:  Total I/O In: 800 [I.V.:800] Out: 450 [Urine:400; Blood:50]  BLOOD ADMINISTERED:none  DRAINS: wet to dry cerlex dressing placed in the subcutaneous space over the entire length of the cesarean section incision, and also in the percutaneous drain tract in LLQ.   LOCAL MEDICATIONS USED:  MARCAINE    and Amount: 30 ml  SPECIMEN:  Source of Specimen:  wound culture LLQ  DISPOSITION OF SPECIMEN:  microbiology  COUNTS:  YES  TOURNIQUET:  * No tourniquets in log *  DICTATION: .Dragon Dictation  PLAN OF CARE: has inpatient admission   PATIENT DISPOSITION:  PACU - hemodynamically stable.   Delay start of Pharmacological VTE agent (>24hrs) due to surgical blood loss or risk of bleeding: not applicable  Details of procedure: Patient was taken operating room prepped and draped for lower abdominal reexploration of existing wound. The dressing was removed. There was some peripheral skin erythema on the adjacent skin on the right lower quadrant from the serous drainage   there is no erythema on the left side and skin was intact the previous pigtail drain site in the left lower quadrant above the cesarean section incision. Grossly normal superficially. Timeout was conducted. Patient was previously artery on antibiotics and dose of the Flagyl which was artery being administered was given preoperatively abdomen was prepped with Betadine.. A transverse incision was inspected. Pressure around it resulted in some bloody fluid with no  purulence filling the right-sided wound site. Some of it coming from apparently from the left side. The wound on the left side was then placed under traction and easily popped open revealing a hematoma just beneath the skin. The right one-Third of the incision was opened completely and explored with evacuation of the hematoma followed by oozing a large pocket of abscess with purulent material was no malodor. Decision was made to completely open the incision which opened easily down to the level of the fascia. The ends of the incision had evidence of prior dissection in the subcutaneous fatty space that extended laterally and superiorly at the end of the cesarean skin incision. On the left side  the extension area was the site of the pocket. The entire incision was irrigated copiously with careful irrigation of the corners. Patient in of the field no additional extension of the section and of the fatty tissues. The fascia was carefully inspected and palpated and pressed across. The fascia was intact across the entire left side and in the midline. On the right side just beneath the original opening the previous surgery a 2 cm area overlying the rectus muscles were the fascia was necrotic and the suture pulled through. The risks remainder of the fascia was well attached to the underlying musculature and there was no evidence of any tract into the abdominal cavity on thorough palpation of the fascia and underlying musculature. Further irrigation was performed and the adjacent tissues sized vigorously to see if there is any purulent tissue to be identified. There was none. Because of the defect extended so far past the  left end of the incision and tunneled up almost to the surface skin. A separate stab incision was made at the lateral most and she of the fatty defect approximately 2 cm lateral and 2 cm superior to the end of the cesarean section incision. Decision was made to pack the dressing with wet moistened Kerlix.  2 separate to the Kerlix were used. One was allowed to exit through this recent stab incision in the right lower quadrant extended only to the lateral one third of the incision. This will be removed by extracting it from the small stab incision.  Upon completion of the Kerlix packing a series of 4 x 4's were placed along the incision which was total length of 11 inches. ABDs was applied and the wound prepared for taping. During this we noticed some necrotic fatty tissue being expelled from the JP drain site superior to the incision in the left lower quadrant. Pigtail J P drain site was then opened to a distance of 2 cm with an approach to the depth of the shear. Small amount of very superficial debris infected necrotic fatty tissue from just beneath the skin. I did not identify any purulent loculations and this area did not communicate with the previous abscess in the left side of the incision. This was also packed with a single strip of Kerlix and 4 x 4's. Dressing was applied with ABDs and placed and patient allowed to awaken good recovery room in good condition. Photos documenting the condition of the abdominal incisions were incorporated into [facility]   Please look under media for the photographs

## 2015-11-07 NOTE — Progress Notes (Signed)
Dr. Emelda FearFerguson called regarding pts BP, orders received.

## 2015-11-07 NOTE — Anesthesia Procedure Notes (Signed)
Procedure Name: Intubation Date/Time: 11/07/2015 8:21 PM Performed by: Renford DillsMULLINS, Gyasi Hazzard L Pre-anesthesia Checklist: Patient identified, Emergency Drugs available, Suction available and Patient being monitored Patient Re-evaluated:Patient Re-evaluated prior to inductionOxygen Delivery Method: Circle system utilized Preoxygenation: Pre-oxygenation with 100% oxygen Intubation Type: IV induction, Rapid sequence and Cricoid Pressure applied Laryngoscope Size: Miller and 2 Grade View: Grade I Tube type: Oral Tube size: 7.0 mm Number of attempts: 1 Airway Equipment and Method: Stylet Placement Confirmation: ETT inserted through vocal cords under direct vision,  positive ETCO2 and breath sounds checked- equal and bilateral Secured at: 21 cm Tube secured with: Tape Dental Injury: Teeth and Oropharynx as per pre-operative assessment

## 2015-11-07 NOTE — Anesthesia Procedure Notes (Addendum)
Procedure Name: Intubation Date/Time: 11/07/2015 8:21 PM Performed by: Renford DillsMULLINS, Jordyn Hofacker L Pre-anesthesia Checklist: Patient identified, Emergency Drugs available, Suction available and Patient being monitored Patient Re-evaluated:Patient Re-evaluated prior to inductionOxygen Delivery Method: Circle system utilized Preoxygenation: Pre-oxygenation with 100% oxygen Intubation Type: IV induction, Rapid sequence and Cricoid Pressure applied Laryngoscope Size: Miller and 2 Grade View: Grade I Tube type: Oral Number of attempts: 1 Airway Equipment and Method: Stylet Placement Confirmation: ETT inserted through vocal cords under direct vision,  positive ETCO2 and breath sounds checked- equal and bilateral Secured at: 21 cm Tube secured with: Tape Dental Injury: Teeth and Oropharynx as per pre-operative assessment

## 2015-11-07 NOTE — Brief Op Note (Addendum)
10/31/2015 - 11/07/2015  9:32 PM  PATIENT:  Sharlene MottsKristian Makara  20 y.o. female  PRE-OPERATIVE DIAGNOSIS:  C-Section wound abcess,  POST-OPERATIVE DIAGNOSIS:  infected wound hematoma  PROCEDURE:  Procedure(s) with comments: INCISION AND DRAINAGE ABSCESS (Bilateral) - c/section incision site Incision and drainage of JP drain tract. SURGEON:  Surgeon(s) and Role:    * Tilda BurrowJohn V Kaylan Yates, MD - Primary  PHYSICIAN ASSISTANT:   ASSISTANTS: none   ANESTHESIA:   general  EBL:  Total I/O In: 800 [I.V.:800] Out: 450 [Urine:400; Blood:50]  BLOOD ADMINISTERED:none  DRAINS: wet to dry cerlex dressing placed in the subcutaneous space over the entire length of the cesarean section incision, and also in the percutaneous drain tract in LLQ.   LOCAL MEDICATIONS USED:  MARCAINE    and Amount: 30 ml  SPECIMEN:  Source of Specimen:  wound culture LLQ  DISPOSITION OF SPECIMEN:  microbiology  COUNTS:  YES  TOURNIQUET:  * No tourniquets in log *  DICTATION: .Dragon Dictation  PLAN OF CARE: has inpatient admission   PATIENT DISPOSITION:  PACU - hemodynamically stable.   Delay start of Pharmacological VTE agent (>24hrs) due to surgical blood loss or risk of bleeding: not applicable

## 2015-11-07 NOTE — Care Management Note (Signed)
Case Management Note  Patient Details  Name: Raven [name] MRN: [id number] Date of Birth: 1995-12-09  Subjective/Objective:  20 y.o. F who has been in the hospital since 10/31/15. Currently has residual hematoma and plans are to have evacuation in OR this pm. Spoke to Dr. Emelda FearFerguson who has requested assistance with Wound Vac for home for this pt and is most familiar with [employer]. Pt will have Wet to dry dressing tonight and Vac placed 11/08/2015. NO IV abx.                   Action/Plan: Ricki, [facility] rep, contacted by phone. She will complete all necessary paperwork and [facility] for [facility] to sign. [facility]. No further CM needs at this time.     Expected Discharge Date:                  Expected Discharge Plan:  Home w Home Health Services  In-House Referral:     Discharge planning Services  CM Consult  Post Acute Care Choice:  Durable Medical Equipment Choice offered to:     DME Arranged:  Negative pressure wound device DME Agency:  [facility]  HH Arranged:    HH Agency:     Status of Service:  In process, will continue to follow  If discussed at Long Length of Stay Meetings, dates discussed:    Additional Comments:  Raven [name], Raven Soden M, RN 11/07/2015, 3:49 PM

## 2015-11-07 NOTE — Progress Notes (Addendum)
5 Days Post-Op Procedure(s) (LRB): WOUND EXPLORATION (N/A) with wound abscess on right half of prior cesarean scar. I packed the wound yesterday morning and noted quite a bit of hematoma in the base of the incision, removing most of it. Some clot extracted from the communicating tract to the left end of the incision. U/s has been ordered. Subjective: Patient reports incisional pain.  Predominantly on the left   Objective: I have reviewed patient's vital signs, labs and microbiology. BP (!) 142/73 (BP Location: Right Arm)   Pulse (!) 44   Temp 97.8 F (36.6 C) (Oral)   Resp 18   Ht 5\' 2"  (1.575 m)   Wt 195 lb 12 oz (88.8 kg)   SpO2 99%   BMI 35.80 kg/m    General: alert, cooperative and no distress GI: soft, non-tender; bowel sounds normal; no masses,  no organomegaly Incision: dressing removed, and packing extracted. Some clot fragments on both ends of the packing. U/s reviewed:  FINDINGS: Focused ultrasound exam was performed in the region of the previous low transverse incision. Imaging was difficult secondary to overlying bandage material and open wound. Complex fluid collection is identified towards the left lateral aspect of the incision measuring 2.1 x 1.7 x 4.1 cm.  IMPRESSION: Persistent fluid identified towards the left side of the low transverse incision.   Electronically Signed   By: Kennith CenterEric  Mansell M.D.   On: 11/07/2015 12:49 CBC    Component Value Date/Time   WBC 11.4 (H) 11/07/2015 0535   RBC 2.70 (L) 11/07/2015 0535   HGB 8.2 (L) 11/07/2015 0535   HCT 25.1 (L) 11/07/2015 0535   PLT 508 (H) 11/07/2015 0535   MCV 93.0 11/07/2015 0535   MCH 30.4 11/07/2015 0535   MCHC 32.7 11/07/2015 0535   RDW 14.7 11/07/2015 0535   LYMPHSABS 1.5 11/07/2015 0535   MONOABS 0.3 11/07/2015 0535   EOSABS 0.0 11/07/2015 0535   BASOSABS 0.0 11/07/2015 0535    Assessment: s/p Procedure(s): WOUND EXPLORATION (N/A): residual hematoma at the left end of the incision,  unable to reach thru the existing incision. RECOMMEND EVACUATION OF RESIDUAL HEMATOMA, WHICH WILL require return to OR for adequate evacuation. Explained to patient with alternatives/ risks/ potential benefits.  Plan: NPO  To OR at 8 pm for evac of residual hematoma. Wet to dry dressing Will contact wound vac for converting to wound vac tomorrow .  LOS: 7 days    Kaven Cumbie V 11/07/2015, 2:23 PM

## 2015-11-07 NOTE — Transfer of Care (Signed)
Immediate Anesthesia Transfer of Care Note  Patient: Raven [name]  Procedure(s) Performed: Procedure(s) with comments: INCISION AND DRAINAGE ABSCESS (Bilateral) - c/section incision site  Patient Location: PACU  Anesthesia Type:General  Level of Consciousness: awake  Airway & Oxygen Therapy: Patient Spontanous Breathing and Patient connected to nasal cannula oxygen  Post-op Assessment: Report given to RN and Post -op Vital signs reviewed and stable  Post vital signs: stable  Last Vitals:  Vitals:   11/07/15 1733 11/07/15 1933  BP: (!) 154/91 (!) 159/98  Pulse: (!) 50 (!) 48  Resp: 18 18  Temp: 37 C 37.2 C    Last Pain:  Vitals:   11/07/15 1933  TempSrc: Oral  PainSc:       Patients Stated Pain Goal: 4 (11/07/15 1708)  Complications: No apparent anesthesia complications

## 2015-11-07 NOTE — Anesthesia Preprocedure Evaluation (Addendum)
Anesthesia Evaluation  Patient identified by MRN, date of birth, ID band Patient awake    Reviewed: Allergy & Precautions, NPO status , Patient's Chart, lab work & pertinent test results  History of Anesthesia Complications Negative for: history of anesthetic complications  Airway Mallampati: III  TM Distance: >3 FB Neck ROM: Full    Dental  (+) Dental Advisory Given, Teeth Intact   Pulmonary neg shortness of breath, neg sleep apnea, neg COPD, neg recent URI, Current Smoker,    Pulmonary exam normal breath sounds clear to auscultation       Cardiovascular negative cardio ROS   Rhythm:Regular Rate:Normal     Neuro/Psych negative neurological ROS     GI/Hepatic negative GI ROS, Neg liver ROS, neg GERD  ,  Endo/Other  neg diabetesHypothyroidism   Renal/GU negative Renal ROS     Musculoskeletal scoliosis   Abdominal (+) + obese,   Peds  Hematology negative hematology ROS (+)   Anesthesia Other Findings Pelvic abscess s/p c-section. 12 days postpartum  Reproductive/Obstetrics                             Anesthesia Physical  Anesthesia Plan  ASA: II  Anesthesia Plan: General   Post-op Pain Management:    Induction: Intravenous  Airway Management Planned: Oral ETT  Additional Equipment:   Intra-op Plan:   Post-operative Plan: Extubation in OR  Informed Consent: I have reviewed the patients History and Physical, chart, labs and discussed the procedure including the risks, benefits and alternatives for the proposed anesthesia with the patient or authorized representative who has indicated his/her understanding and acceptance.   Dental advisory given  Plan Discussed with:   Anesthesia Plan Comments: (Risks of general anesthesia discussed including, but not limited to, sore throat, hoarse voice, chipped/damaged teeth, injury to vocal cords, nausea and vomiting, allergic reactions,  lung infection, heart attack, stroke, and death. All questions answered. )       Anesthesia Quick Evaluation

## 2015-11-07 NOTE — Progress Notes (Signed)
Dr Bretta BangGermaroth called regarding pts bp, orders received and meds given per order. No real change in BP. Dr. Bretta BangGermaroth in to see patient and more orders received including pain med.  Pt awake and talking, discussing regimen for pain prior to surgery.  Will continue to monitor and manage pain and BP.

## 2015-11-08 ENCOUNTER — Encounter (HOSPITAL_COMMUNITY): Payer: Self-pay | Admitting: Obstetrics and Gynecology

## 2015-11-08 DIAGNOSIS — O8689 Other specified puerperal infections: Secondary | ICD-10-CM

## 2015-11-08 DIAGNOSIS — L02211 Cutaneous abscess of abdominal wall: Secondary | ICD-10-CM

## 2015-11-08 LAB — CBC WITH DIFFERENTIAL/PLATELET
Basophils Absolute: 0 10*3/uL (ref 0.0–0.1)
Basophils Relative: 0 %
Eosinophils Absolute: 0 10*3/uL (ref 0.0–0.7)
Eosinophils Relative: 0 %
HEMATOCRIT: 24.5 % — AB (ref 36.0–46.0)
HEMOGLOBIN: 8.2 g/dL — AB (ref 12.0–15.0)
LYMPHS ABS: 1.1 10*3/uL (ref 0.7–4.0)
Lymphocytes Relative: 9 %
MCH: 31.3 pg (ref 26.0–34.0)
MCHC: 33.5 g/dL (ref 30.0–36.0)
MCV: 93.5 fL (ref 78.0–100.0)
MONOS PCT: 2 %
Monocytes Absolute: 0.2 10*3/uL (ref 0.1–1.0)
NEUTROS ABS: 10.4 10*3/uL — AB (ref 1.7–7.7)
NEUTROS PCT: 89 %
Platelets: 503 10*3/uL — ABNORMAL HIGH (ref 150–400)
RBC: 2.62 MIL/uL — ABNORMAL LOW (ref 3.87–5.11)
RDW: 15.5 % (ref 11.5–15.5)
WBC: 11.8 10*3/uL — ABNORMAL HIGH (ref 4.0–10.5)

## 2015-11-08 MED ORDER — LABETALOL HCL 200 MG PO TABS: 400.0000 mg | ORAL_TABLET | Freq: Two times a day (BID) | ORAL | 3 refills | Status: DC

## 2015-11-08 MED ORDER — METRONIDAZOLE 500 MG PO TABS: 500.0000 mg | ORAL_TABLET | Freq: Three times a day (TID) | ORAL | 0 refills | Status: DC

## 2015-11-08 MED ORDER — HYDROMORPHONE HCL 1 MG/ML IJ SOLN
1.0000 mg | Freq: Once | INTRAMUSCULAR | Status: AC
  Administered 2015-11-08: 1 mg via INTRAVENOUS
  Filled 2015-11-08: qty 1

## 2015-11-08 MED ORDER — HYDROMORPHONE HCL 2 MG PO TABS: 2.0000 mg | ORAL_TABLET | Freq: Four times a day (QID) | ORAL | 0 refills | Status: DC | PRN

## 2015-11-08 MED ORDER — IBUPROFEN 600 MG PO TABS: 600.0000 mg | ORAL_TABLET | Freq: Four times a day (QID) | ORAL | 0 refills | Status: DC | PRN

## 2015-11-08 MED ORDER — LEVOFLOXACIN 750 MG PO TABS: 750.0000 mg | ORAL_TABLET | Freq: Every day | ORAL | 0 refills | Status: DC

## 2015-11-08 NOTE — Progress Notes (Signed)
1 Day Post-Op Procedure(s) (LRB): INCISION AND DRAINAGE ABSCESS (Bilateral) with wet-dry packing  Subjective: Patient reports incisional pain.  Primarily on the left where most of the work was done.   Objective: I have reviewed patient's vital signs, intake and output and labs. CBC    Component Value Date/Time   WBC 11.8 (H) 11/08/2015 0510   RBC 2.62 (L) 11/08/2015 0510   HGB 8.2 (L) 11/08/2015 0510   HCT 24.5 (L) 11/08/2015 0510   PLT 503 (H) 11/08/2015 0510   MCV 93.5 11/08/2015 0510   MCH 31.3 11/08/2015 0510   MCHC 33.5 11/08/2015 0510   RDW 15.5 11/08/2015 0510   LYMPHSABS 1.1 11/08/2015 0510   MONOABS 0.2 11/08/2015 0510   EOSABS 0.0 11/08/2015 0510   BASOSABS 0.0 11/08/2015 0510     General: alert, cooperative and no distress GI: soft, non-tender; bowel sounds normal; no masses,  no organomegaly and incision: dressin in place, awaiting [facility] woundvac providers to arrive for change of dressing will place order for WOC nurse to place woundvac  Assessment: s/p Procedure(s) with comments: INCISION AND DRAINAGE ABSCESS (Bilateral) - c/section incision site: stable and anemia  [facility]. goal is home care on PO flagyl and Cipro  LOS: 8 days    Raven [name] V 11/08/2015, 10:10 AM

## 2015-11-08 NOTE — Progress Notes (Signed)
Patient ID: Raven MottsKristian Digiacomo, female   DOB: [birth year], 20 y.o.   MRN: [id number] See prior notes

## 2015-11-08 NOTE — Progress Notes (Signed)
Pt  Ambulates well  Past wound vacumn     Placement   Teaching complete   Home health arrangements made

## 2015-11-08 NOTE — Care Management Note (Addendum)
Case Management Note  Patient Details  Name: Sharlene MottsKristian Vinton MRN: [id number] Date of Birth: 07-25-1995  Subjective/Objective:                    Action/Plan:   Expected Discharge Date:                  Expected Discharge Plan:  Home w Home Health Services  In-House Referral:     Discharge planning Services  CM Consult  Post Acute Care Choice:  Durable Medical Equipment Choice offered to:     DME Arranged:  Negative pressure wound device, Vac DME Agency:  [facility]  HH Arranged:  RN HH Agency:  [facility]  Status of Service:  Completed, signed off  If discussed at Long Length of Stay Meetings, dates discussed:    Additional Comments:  [facility] delivered home wound vac to patient's room at around 1530 today and planned to go home tonight.  [facility] Lucienne MinksKarin notified by CM of dc today and [facility] will be on Wednesday 11/10/15.  No other needs at this time.    Geoffery LyonsGaines, Girl Schissler Brown, RN 11/08/2015, 2:23 PM

## 2015-11-08 NOTE — Care Management Note (Signed)
Case Management Note  Patient Details  Name: Raven [name] MRN: [id number] Date of Birth: 1996/02/25  Subjective/Objective:                 Wound infection   Action/Plan: Patient was offered choice by Juliann Pares. Craft RN CM on Friday 11/05/15 for [facility] agencies and [facility] chose [facility]  Carroll KindsKarin N. # [phone] notified of referral of the wound vac and that the wound vac care RN dressing changes would start on this Wednesday 11/10/15.  Patient given CM card/phone number and also [facility] phone number.   This am orders were for patient to have hospital  wound vac [facility]. CM called Dawn WOC RN with [facility] and she stated she would be by around 12:30 today to place hospital wound vac on patient.  CM on unit and Dr. Emelda FearFerguson and he gave orders for Home Health and also for Wound Vac, and  papers were signed and Bland SpanRickie Tori with [employer] called [phone] and she came over to pick up papers for [facility]/wound vac for home use.  Awaiting call back from Rickie  to receive [facility] and with approximate delivery time.  Patient verbalizes understand and no questions at this time.   Tentative discharge is for tonight or tomorrow depending on delivery of home wound [facility] wound vac.               Expected Discharge Plan:  Home w Home Health Services     Discharge planning Services  CM Consult  Post Acute Care Choice:  Durable Medical Equipment Choice offered to:   patient  DME Arranged:  Negative pressure wound device DME Agency:  [facility]  HH Arranged:  RN HH Agency:  [facility]  Status of Service:  Completed, signed off Geoffery LyonsGaines, Shacarra Choe Brown, RN 11/08/2015, 2:06 PM

## 2015-11-08 NOTE — Care Management Note (Deleted)
Case Management Note  Patient Details  Name: Raven MottsKristian Tolen MRN: [id number] Date of Birth: May 25, 1995  Subjective/Objective:                    Action/Plan:   Expected Discharge Date:                  Expected Discharge Plan:  Home w Home Health Services  In-House Referral:     Discharge planning Services  CM Consult  Post Acute Care Choice:  Durable Medical Equipment Choice offered to:     DME Arranged:  Negative pressure wound device, Vac DME Agency:  [facility]  HH Arranged:  RN HH Agency:  [facility]  Status of Service:  Completed, signed off  If discussed at Long Length of Stay Meetings, dates discussed:    Additional Comments:  Geoffery LyonsGaines, Scheryl Sanborn Brown, RN 11/08/2015, 3:38 PM

## 2015-11-08 NOTE — Care Management Note (Deleted)
Case Management Note  Patient Details  Name: Raven [name] MRN: [id number] Date of Birth: May 10, 1995  Subjective/Objective:                    Action/Plan:   Expected Discharge Date:                  Expected Discharge Plan:  Home w Home Health Services  In-House Referral:     Discharge planning Services  CM Consult  Post Acute Care Choice:  Durable Medical Equipment Choice offered to:     DME Arranged:  Negative pressure wound device, Vac DME Agency:  [facility]  HH Arranged:  RN HH Agency:  [facility]  Status of Service:  Completed, signed off  If discussed at Long Length of Stay Meetings, dates discussed:    Additional Comments:  Geoffery LyonsGaines, Jo Cerone Brown, RN 11/08/2015, 3:37 PM

## 2015-11-08 NOTE — Discharge Summary (Signed)
OB Discharge Summary     Patient Name: Raven [name] DOB: [birth year] MRN: [id number]  Date of admission: 10/31/2015 Delivering MD: This patient has no babies on file.  Date of discharge: 11/08/2015  Admitting diagnosis: PP, ABD PAIN pelvic abscess C-Section wound abcess Intrauterine pregnancy: Unknown     Secondary diagnosis:  Active Problems:   Postpartum pelvic abscess   Pelvic abscess in female   Post-operative state   Hx of atrial flutter   Subcutaneous abscess  Additional problems: none     Discharge diagnosis: same                                                                                                Procedures: Patient taken to the OR for I&D on 9/5 and 9/10.  Hospital course:  Whole this patient was [facility] on postoperative day 10 from a primary C-section due to breech presentation on 10/31/15. She had been readmitted on August 30 with fevers and chills and found to have a pelvic abscess 2. A percutaneous drain were placed on August 31 by interventional radiology at that hospital. Despite her treatment, the patient continued to have fevers and chills with nausea and vomiting. The patient was transferred here for further care. At our facility, the patient had a CT which showed abdominal wall abscesses. The patient was started on broad-spectrum antibiotics and taken to the OR on 9/5 by Dr. Erin FullingHarraway-Smith for incision and drainage.  The fascia was found to be intact, although approximately 100 mL's of. And fluid was cleaned from the abscess. Please see her note for further details. A PICO wound Vac was placed in the OR. Patient was continued on antibiotics.  On 9/7, the JP drains were removed. The pico was also removed about 250 mL of serosanguineous fluid was expressed from the right side of the incision. On 9/9 her antibiotics were narrowed to levofloxacin and metronidazole. She continued on this over the weekend. Yesterday the patient was taken  back to the OR for reexploration. A hematoma was expressed from the wound, as well as some minor purulent fluid. On day of discharge, a wound VAC was placed and home health was arranged. The patient will be sent home on 10 more days of antibiotics and will be followed up in the office in 2 weeks.  Physical exam  Vitals:   11/08/15 0100 11/08/15 0518 11/08/15 1026 11/08/15 1446  BP: 114/69 (!) 102/55 110/72 108/64  Pulse: 64 (!) 56 (!) 46 (!) 58  Resp: 18 18 16 16   Temp: 98.2 F (36.8 C) 98.4 F (36.9 C) 97.8 F (36.6 C) 98.3 F (36.8 C)  TempSrc: Oral Oral Oral Oral  SpO2: 99% 96% 99% 98%  Weight:      Height:       See Dr. Rayna SextonFerguson's note from this morning  Labs: Lab Results  Component Value Date   WBC 11.8 (H) 11/08/2015   HGB 8.2 (L) 11/08/2015   HCT 24.5 (L) 11/08/2015   MCV 93.5 11/08/2015   PLT 503 (H) 11/08/2015   CMP Latest Ref Rng & Units  11/07/2015  Glucose 65 - 99 mg/dL -  BUN 6 - 20 mg/dL -  Creatinine 4.09 - 8.11 mg/dL 9.14  Sodium 782 - 956 mmol/L -  Potassium 3.5 - 5.1 mmol/L -  Chloride 101 - 111 mmol/L -  CO2 22 - 32 mmol/L -  Calcium 8.9 - 10.3 mg/dL -  Total Protein 6.5 - 8.1 g/dL -  Total Bilirubin 0.3 - 1.2 mg/dL -  Alkaline Phos 38 - 213 U/L -  AST 15 - 41 U/L -  ALT 14 - 54 U/L -    Discharge instruction: per After Visit Summary and "Baby and Me Booklet".  After visit meds:    Medication List    STOP taking these medications   HYDROmorphone HCl 1 MG/ML Liqd Commonly known as:  DILAUDID Replaced by:  HYDROmorphone 2 MG tablet   ketorolac 10 MG tablet Commonly known as:  TORADOL   OxyCODONE HCl (Abuse Deter) 5 MG Taba Commonly known as:  OXAYDO     TAKE these medications   HYDROmorphone 2 MG tablet Commonly known as:  DILAUDID Take 1 tablet (2 mg total) by mouth every 6 (six) hours as needed for severe pain. Replaces:  HYDROmorphone HCl 1 MG/ML Liqd   ibuprofen 600 MG tablet Commonly known as:  ADVIL,MOTRIN Take 1 tablet (600  mg total) by mouth every 6 (six) hours as needed for fever or headache. What changed:  medication strength  how much to take  when to take this  reasons to take this   labetalol 200 MG tablet Commonly known as:  NORMODYNE Take 2 tablets (400 mg total) by mouth 2 (two) times daily.   levofloxacin 750 MG tablet Commonly known as:  LEVAQUIN Take 1 tablet (750 mg total) by mouth daily.   metroNIDAZOLE 500 MG tablet Commonly known as:  FLAGYL Take 1 tablet (500 mg total) by mouth every 8 (eight) hours.       Diet: routine diet  Activity: Advance as tolerated. Pelvic rest for 6 weeks.   Outpatient follow up:2 weeks Follow up Appt:No future appointments. Follow up Visit:No Follow-up on file.  Than 30 minutes was spent on the discharge of this patient.  11/08/2015 Kanan Sobek JEHIEL, DO

## 2015-11-08 NOTE — Plan of Care (Signed)
Problem: Bowel/Gastric: Goal: Gastrointestinal status for postoperative course will improve Outcome: Progressing Deep breathing and incentive spirometer done and encouraged every hour while awake.  Problem: Education: Goal: Knowledge of the prescribed therapeutic regimen will improve Outcome: Progressing Treatment plan discussed and patient claims aware as informed by Dr. Ferguson.   Problem: Urinary Elimination: Goal: Ability to reestablish a normal urinary elimination pattern will improve Outcome: Completed/Met Date Met: 11/08/15 Assisted out of bed as to bathroom, voided without any difficulty   

## 2015-11-08 NOTE — Anesthesia Postprocedure Evaluation (Signed)
Anesthesia Post Note  Patient: Raven [name]  Procedure(s) Performed: Procedure(s) (LRB): INCISION AND DRAINAGE ABSCESS (Bilateral)  Patient location during evaluation: [facility] Anesthesia Type: General Level of consciousness: awake and alert Pain management: satisfactory to patient Vital Signs Assessment: post-procedure vital signs reviewed and stable Respiratory status: spontaneous breathing and respiratory function stable Cardiovascular status: stable Postop Assessment: adequate PO intake Anesthetic complications: no     Last Vitals:  Vitals:   11/08/15 0100 11/08/15 0518  BP: 114/69 (!) 102/55  Pulse: 64 (!) 56  Resp: 18 18  Temp: 36.8 C 36.9 C    Last Pain:  Vitals:   11/08/15 0530  TempSrc:   PainSc: 4    Pain Goal: Patients Stated Pain Goal: 4 (11/08/15 0530)               Karleen DolphinFUSSELL,Kielyn Kardell

## 2015-11-08 NOTE — Consult Note (Addendum)
WOC Nurse wound consult note Reason for Consult: Consult requested for abd Vac application Wound type: 3 areas of full thickness post-op wounds Measurement: Lower abd 1X15X3.5cm, beefy red, small amt blood-tinged drainage, no odor Left upper abd with same appearance; 3X.2X4cm and left lower 2X.2X3cm  Periwound: Generalized erythremia surrounding the wounds related to tape removal Dressing procedure/placement/frequency: Applied one piece of black sponge to each wound and connected with a bridge to 125mm cont suction.  Pt was medicated for pain prior to the procedure but it was still very painful.  Case manager has contacted [employer] for Vac home machine delivery.  Pt plans to discharge soon and home health can change dressings Q M/WF. Please re-consult if further assistance is needed.  Thank-you,  Cammie Mcgeeawn Gemini Bunte MSN, RN, CWOCN, GriffinWCN-AP, CNS [phone]

## 2015-11-09 LAB — ANAEROBIC CULTURE

## 2015-11-10 LAB — AEROBIC CULTURE W GRAM STAIN (SUPERFICIAL SPECIMEN)

## 2015-11-10 LAB — AEROBIC CULTURE  (SUPERFICIAL SPECIMEN)

## 2015-11-13 LAB — ANAEROBIC CULTURE

## 2015-11-17 ENCOUNTER — Telehealth: Payer: Self-pay | Admitting: *Deleted

## 2015-11-17 NOTE — Telephone Encounter (Signed)
Wound vac nurse states pt has 3 incisions from c-section and abscess, would like to discontinue wound vac and do wet to dry x 2 wounds.  Call got disconnected before could be resolved.

## 2015-11-18 ENCOUNTER — Telehealth: Payer: Self-pay | Admitting: Obstetrics & Gynecology

## 2015-11-18 ENCOUNTER — Encounter: Payer: Self-pay | Admitting: Obstetrics & Gynecology

## 2015-11-18 NOTE — Telephone Encounter (Signed)
PT called stating that she is does not have gas or any type of transportation to come to her appointment. Pt states that lives an hour away and would like to know if she could see someone in [place] for her postop appointment. Please contact pt

## 2015-11-18 NOTE — Telephone Encounter (Signed)
Pt husband states he is not able to bring pt to her appt today due to transportation issues, states they can go to [facility] because [facility] is closer. Pt husband advised to take pt to [facility] for wound evaluaton. Pt husband states they will not be able to go today but will be able to go tomorrow to [facility]. Informed that would be ok.  Pt has been followed by a wound care nurse.

## 2015-11-24 ENCOUNTER — Telehealth: Payer: Self-pay | Admitting: *Deleted

## 2015-11-24 NOTE — Telephone Encounter (Signed)
LM on Raven [name]'s VM that this pt is not a pt at this clinic, she was [facility] and is not going to come to this office for wound care, [facility].

## 2015-11-29 ENCOUNTER — Encounter: Payer: Self-pay | Admitting: Obstetrics and Gynecology

## 2015-11-29 ENCOUNTER — Ambulatory Visit (INDEPENDENT_AMBULATORY_CARE_PROVIDER_SITE_OTHER): Payer: Medicaid Other | Admitting: Obstetrics and Gynecology

## 2015-11-29 VITALS — BP 102/70 | HR 64 | Wt 174.0 lb

## 2015-11-29 DIAGNOSIS — O8600 Infection of obstetric surgical wound, unspecified: Secondary | ICD-10-CM

## 2015-11-29 MED ORDER — SILVER SULFADIAZINE 1 % EX CREA: 1.0000 "application " | TOPICAL_CREAM | Freq: Every day | CUTANEOUS | 0 refills | Status: DC

## 2015-11-29 NOTE — Progress Notes (Addendum)
Patient ID: Raven [name] Komorowski, female   DOB: 09-30-1995, 20 y.o.   MRN: [id number]   Subjective:  Raven [name] Minehart is a 20 y.o. female now 3 weeks status postl I&D-C/section incision site for postoup wound infection. She has been seen by wound care team, which has stopped visiting pt. She has some weepy discharge and skin irritation near the incision granulation tissue on left, which is 3 cm long x 4 mm wide.  Chief Complaint  Patient presents with  . Follow-up    c/section incision check s/p I&D    Pt had wound vac placed on 11/08/15, which has since been removed (approximately 1 week ago). Pt states her wound is no longer draining. She states she has been changing the dressing daily. She notes an odor that is unpleasant to her.  Review of Systems Negative    Diet:   normal   Bowel movements : normal.  The patient is not having any pain.  Objective:  BP 102/70 (BP Location: Left Arm, Patient Position: Sitting, Cuff Size: Normal)   Pulse 64   Wt 174 lb (78.9 kg)   Breastfeeding? No   BMI 31.83 kg/m  General:Well developed, well nourished.  No acute distress. Abdomen: Bowel sounds normal, soft, non-tender.  Incision(s):   Healing slowly, no hernia, no swelling, no dehiscence, 3 cm x 5 mm strip of granulation tissue producing chronic moisture changes along the left half of the incision (see photo).   Rx'd with Agno3 to treat the left half of incision line where granulation tissue remains fragile.     Assessment:  Post-Op 3 weeks status post bilateral I&D-C/section incision site.  Granulation tissue treated with AgNO3.    Doing well postoperatively.   Plan:  1.Wound care discussed: Rx for silvadene cream provided; continue daily dressing changes; keep area moisturized and covered with bandage; pt provided with 4x4 gauze   2. . current medications: none 3. Activity restrictions: none 4. return to work: not applicable. 5. Follow up in 2 week for recheck  Preferably at [facility]  Provider office.   By signing my name below, I, Doreatha MartinEva Mathews, attest that this documentation has been prepared under the direction and in the presence of Tilda BurrowJohn V Akram Kissick, MD. Electronically Signed: Doreatha MartinEva Mathews, ED Scribe. 11/29/15. 9:50 AM.  I personally performed the services described in this documentation, which was SCRIBED in my presence. The recorded information has been reviewed and considered accurate. It has been edited as necessary during review. Tilda BurrowFERGUSON,Lennex Pietila V, MD

## 2016-06-14 ENCOUNTER — Inpatient Hospital Stay (HOSPITAL_COMMUNITY): Payer: Medicaid Other

## 2016-06-14 ENCOUNTER — Inpatient Hospital Stay (HOSPITAL_COMMUNITY)
Admission: AD | Admit: 2016-06-14 | Discharge: 2016-06-14 | Disposition: A | Discharge status: Home / Self Care | Payer: Medicaid Other | Source: Ambulatory Visit | Attending: Obstetrics & Gynecology | Admitting: Obstetrics & Gynecology

## 2016-06-14 ENCOUNTER — Encounter (HOSPITAL_COMMUNITY): Payer: Self-pay | Admitting: *Deleted

## 2016-06-14 DIAGNOSIS — R102 Pelvic and perineal pain: Secondary | ICD-10-CM | POA: Insufficient documentation

## 2016-06-14 DIAGNOSIS — R1032 Left lower quadrant pain: Secondary | ICD-10-CM | POA: Diagnosis present

## 2016-06-14 DIAGNOSIS — Z880 Allergy status to penicillin: Secondary | ICD-10-CM | POA: Insufficient documentation

## 2016-06-14 DIAGNOSIS — F17210 Nicotine dependence, cigarettes, uncomplicated: Secondary | ICD-10-CM | POA: Diagnosis not present

## 2016-06-14 DIAGNOSIS — N946 Dysmenorrhea, unspecified: Secondary | ICD-10-CM | POA: Diagnosis not present

## 2016-06-14 LAB — URINALYSIS, ROUTINE W REFLEX MICROSCOPIC
BILIRUBIN URINE: NEGATIVE
Glucose, UA: NEGATIVE mg/dL
Ketones, ur: NEGATIVE mg/dL
NITRITE: NEGATIVE
PROTEIN: NEGATIVE mg/dL
Specific Gravity, Urine: 1.021 (ref 1.005–1.030)
pH: 7 (ref 5.0–8.0)

## 2016-06-14 LAB — CBC WITH DIFFERENTIAL/PLATELET
Basophils Absolute: 0 10*3/uL (ref 0.0–0.1)
Basophils Relative: 0 %
Eosinophils Absolute: 0 10*3/uL (ref 0.0–0.7)
Eosinophils Relative: 1 %
HEMATOCRIT: 38.5 % (ref 36.0–46.0)
HEMOGLOBIN: 13.5 g/dL (ref 12.0–15.0)
LYMPHS PCT: 31 %
Lymphs Abs: 1.9 10*3/uL (ref 0.7–4.0)
MCH: 30.4 pg (ref 26.0–34.0)
MCHC: 35.1 g/dL (ref 30.0–36.0)
MCV: 86.7 fL (ref 78.0–100.0)
MONOS PCT: 3 %
Monocytes Absolute: 0.2 10*3/uL (ref 0.1–1.0)
NEUTROS ABS: 4.1 10*3/uL (ref 1.7–7.7)
NEUTROS PCT: 65 %
Platelets: 229 10*3/uL (ref 150–400)
RBC: 4.44 MIL/uL (ref 3.87–5.11)
RDW: 12.7 % (ref 11.5–15.5)
WBC: 6.3 10*3/uL (ref 4.0–10.5)

## 2016-06-14 LAB — WET PREP, GENITAL
Clue Cells Wet Prep HPF POC: NONE SEEN
Sperm: NONE SEEN
Trich, Wet Prep: NONE SEEN
Yeast Wet Prep HPF POC: NONE SEEN

## 2016-06-14 LAB — POCT PREGNANCY, URINE: PREG TEST UR: NEGATIVE

## 2016-06-14 MED ORDER — PROMETHAZINE HCL 25 MG/ML IJ SOLN
25.0000 mg | Freq: Once | INTRAMUSCULAR | Status: AC
  Administered 2016-06-14: 25 mg via INTRAMUSCULAR
  Filled 2016-06-14: qty 1

## 2016-06-14 MED ORDER — NAPROXEN 500 MG PO TABS: 500.0000 mg | ORAL_TABLET | Freq: Two times a day (BID) | ORAL | 0 refills | Status: AC

## 2016-06-14 MED ORDER — MORPHINE SULFATE (PF) 4 MG/ML IV SOLN
4.0000 mg | Freq: Once | INTRAVENOUS | Status: AC
Start: 2016-06-14 — End: 2016-06-14
  Administered 2016-06-14: 4 mg via INTRAMUSCULAR
  Filled 2016-06-14: qty 1

## 2016-06-14 NOTE — Discharge Instructions (Signed)
Dysmenorrhea Dysmenorrhea means painful cramps during your period (menstrual period). You will have pain in your lower belly (abdomen). The pain is caused by the tightening (contracting) of the muscles of the womb (uterus). The pain may be mild or very bad. With this condition, you may:  Have a headache.  Feel sick to your stomach (nauseous).  Throw up (vomit).  Have lower back pain. Follow these instructions at home: Helping pain and cramping   Put heat on your lower back or belly when you have pain or cramps. Use the heat source that your doctor tells you to use.  Place a towel between your skin and the heat.  Leave the heat on for 20-30 minutes.  Remove the heat if your skin turns bright red. This is especially important if you cannot feel pain, heat, or cold.  Do not have a heating pad on during sleep.  Do aerobic exercises. These include walking, swimming, or biking. These may help with cramps.  Massage your lower back or belly. This may help lessen pain. General instructions   Take over-the-counter and prescription medicines only as told by your doctor.  Do not drive or use heavy machinery while taking prescription pain medicine.  Avoid alcohol and caffeine during and right before your period. These can make cramps worse.  Do not use any products that have nicotine or tobacco. These include cigarettes and e-cigarettes. If you need help quitting, ask your doctor.  Keep all follow-up visits as told by your doctor. This is important. Contact a doctor if:  You have pain that gets worse.  You have pain that does not get better with medicine.  You have pain during sex.  You feel sick to your stomach or you throw up during your period, and medicine does not help. Get help right away if:  You pass out (faint). Summary  Dysmenorrhea means painful cramps during your period (menstrual period).  Put heat on your lower back or belly when you have pain or cramps.  Do  exercises like walking, swimming, or biking to help with cramps.  Contact a doctor if you have pain during sex. This information is not intended to replace advice given to you by your health care provider. Make sure you discuss any questions you have with your health care provider. Document Released: 05/12/2008 Document Revised: 03/02/2016 Document Reviewed: 03/02/2016 Elsevier Interactive Patient Education  2017 [employer]  

## 2016-06-14 NOTE — MAU Provider Note (Signed)
History     CSN: [id number]  Arrival date and time: 06/14/16 1252  Chief Complaint  Patient presents with  . Abdominal Pain  . Fever   Non-pregnant female here with LLQ pain x4 days. Describes as sharp and shooting into thighs. Rates pain 10/10. Also reports heavy VB today, has saturated through 7 pads. States this is not normal for her and she's had only 1 period since giving birth to her son 8 mos ago. Reports a fever of 104.0 this am. Took 2 Ibuprofen and Tylenol #3 about 3 hrs ago. She was seen in another ED 2 days ago for same sx and sent home with Flagyl for BV along with pain medication for her pelvic pain. Review of her chart shows hx of postpartum pelvic abscess in 10/2015. She is not using contraception at this time.    Past Medical History:  Diagnosis Date  . Hypothyroidism   . Scoliosis     Past Surgical History:  Procedure Laterality Date  . CESAREAN SECTION    . INCISION AND DRAINAGE ABSCESS Bilateral 11/07/2015   Procedure: INCISION AND DRAINAGE ABSCESS;  Surgeon: Tilda Burrow, MD;  Location: [facility];  Service: Gynecology;  Laterality: Bilateral;  c/section incision site  . TONSILLECTOMY    . WOUND EXPLORATION N/A 11/02/2015   Procedure: WOUND EXPLORATION;  Surgeon: Willodean Rosenthal, MD;  Location: [facility];  Service: Gynecology;  Laterality: N/A;    History reviewed. No pertinent family history.  Social History  Substance Use Topics  . Smoking status: Current Every Day Smoker    Packs/day: 0.50    Types: Cigarettes  . Smokeless tobacco: Never Used  . Alcohol use No    Allergies:  Allergies  Allergen Reactions  . Penicillins Anaphylaxis and Swelling    Has patient had a PCN reaction causing immediate rash, facial/tongue/throat swelling, SOB or lightheadedness with hypotension: Yes Has patient had a PCN reaction causing severe rash involving mucus membranes or skin necrosis: No Has patient had a PCN reaction that required hospitalization Yes Has  patient had a PCN reaction occurring within the last 10 years: No If all of the above answers are "NO", then may proceed with Cephalosporin use.   . Tylenol [Acetaminophen] Swelling  . Sulfa Antibiotics Hives and Rash    Prescriptions Prior to Admission  Medication Sig Dispense Refill Last Dose  . acetaminophen-codeine (TYLENOL #3) 300-30 MG tablet Take 1-2 tablets by mouth every 6 (six) hours as needed for pain.   06/14/2016 at Unknown time  . dicyclomine (BENTYL) 20 MG tablet Take 20 mg by mouth every 6 (six) hours.   06/14/2016 at Unknown time  . metroNIDAZOLE (FLAGYL) 500 MG tablet Take 500 mg by mouth 3 (three) times daily.   06/13/2016 at Unknown time    Review of Systems  Constitutional: Positive for fever.  Gastrointestinal: Positive for abdominal pain. Negative for constipation, diarrhea, nausea and vomiting.  Genitourinary: Positive for pelvic pain and vaginal bleeding. Negative for dysuria, frequency, hematuria, urgency and vaginal discharge.   Physical Exam   Blood pressure 114/64, pulse 82, temperature 97.4 F (36.3 C), temperature source Oral, resp. rate 18, height  (1.575 m), weight 70.3 kg (155 lb), last menstrual period 06/13/2016, not currently breastfeeding.  Physical Exam  Nursing note and vitals reviewed. Constitutional: She is oriented to person, place, and time. She appears well-developed and well-nourished. No distress.  HENT:  Head: Normocephalic and atraumatic.  Neck: Normal range of motion. Neck supple.  Cardiovascular: Normal rate.  Respiratory: Effort normal.  GI: Soft. She exhibits no distension and no mass. There is generalized tenderness. There is no rebound and no guarding.  Genitourinary:  Genitourinary Comments: External: no lesions or erythema Vagina: rugated, parous, small drk bloody discharge Uterus: non enlarged, anteverted, ++ tender, NO CMT Adnexae: no masses, ++ tenderness left, + tenderness right   Musculoskeletal: Normal range of  motion.  Neurological: She is alert and oriented to person, place, and time.  Skin: Skin is warm and dry.  Psychiatric: She has a normal mood and affect.   Results for orders placed or performed during the hospital encounter of 06/14/16 (from the past 24 hour(s))  Urinalysis, Routine w reflex microscopic     Status: Abnormal   Collection Time: 06/14/16  1:09 PM  Result Value Ref Range   Color, Urine YELLOW YELLOW   APPearance CLEAR CLEAR   Specific Gravity, Urine 1.021 1.005 - 1.030   pH 7.0 5.0 - 8.0   Glucose, UA NEGATIVE NEGATIVE mg/dL   Hgb urine dipstick LARGE (A) NEGATIVE   Bilirubin Urine NEGATIVE NEGATIVE   Ketones, ur NEGATIVE NEGATIVE mg/dL   Protein, ur NEGATIVE NEGATIVE mg/dL   Nitrite NEGATIVE NEGATIVE   Leukocytes, UA TRACE (A) NEGATIVE   RBC / HPF 0-5 0 - 5 RBC/hpf   WBC, UA 6-30 0 - 5 WBC/hpf   Bacteria, UA RARE (A) NONE SEEN   Squamous Epithelial / LPF 6-30 (A) NONE SEEN   Mucous PRESENT   Pregnancy, urine POC     Status: None   Collection Time: 06/14/16  1:26 PM  Result Value Ref Range   Preg Test, Ur NEGATIVE NEGATIVE  Wet prep, genital     Status: Abnormal   Collection Time: 06/14/16  1:48 PM  Result Value Ref Range   Yeast Wet Prep HPF POC NONE SEEN NONE SEEN   Trich, Wet Prep NONE SEEN NONE SEEN   Clue Cells Wet Prep HPF POC NONE SEEN NONE SEEN   WBC, Wet Prep HPF POC FEW (A) NONE SEEN   Sperm NONE SEEN   CBC with Differential/Platelet     Status: None   Collection Time: 06/14/16  2:14 PM  Result Value Ref Range   WBC 6.3 4.0 - 10.5 K/uL   RBC 4.44 3.87 - 5.11 MIL/uL   Hemoglobin 13.5 12.0 - 15.0 g/dL   HCT 78.2 95.6 - 21.3 %   MCV 86.7 78.0 - 100.0 fL   MCH 30.4 26.0 - 34.0 pg   MCHC 35.1 30.0 - 36.0 g/dL   RDW 08.6 57.8 - 46.9 %   Platelets 229 150 - 400 K/uL   Neutrophils Relative % 65 %   Neutro Abs 4.1 1.7 - 7.7 K/uL   Lymphocytes Relative 31 %   Lymphs Abs 1.9 0.7 - 4.0 K/uL   Monocytes Relative 3 %   Monocytes Absolute 0.2 0.1 - 1.0  K/uL   Eosinophils Relative 1 %   Eosinophils Absolute 0.0 0.0 - 0.7 K/uL   Basophils Relative 0 %   Basophils Absolute 0.0 0.0 - 0.1 K/uL   US Transvaginal Non-ob  Result Date: 06/14/2016 CLINICAL DATA:  Pelvic pain, history of C-section EXAM: TRANSABDOMINAL AND TRANSVAGINAL ULTRASOUND OF PELVIS DOPPLER ULTRASOUND OF OVARIES TECHNIQUE: Both transabdominal and transvaginal ultrasound examinations of the pelvis were performed. Transabdominal technique was performed for global imaging of the pelvis including uterus, ovaries, adnexal regions, and pelvic cul-de-sac. It was necessary to proceed with endovaginal exam following the transabdominal exam to visualize  the endometrium. Color and duplex Doppler ultrasound was utilized to evaluate blood flow to the ovaries. COMPARISON:  None. FINDINGS: Uterus Measurements: 6.9 x 3.7 x 4.5 cm. No fibroids or other mass visualized. Low anterior C-section scar. Endometrium Thickness: 6 mm.  No focal abnormality visualized. Right ovary Measurements: 2.8 x 2.4 x 2.5 cm. Normal appearance/no adnexal mass. Left ovary Measurements: 2.2 x 1.7 x 2.6 cm. Normal appearance/no adnexal mass. Pulsed Doppler evaluation of both ovaries demonstrates normal low-resistance arterial and venous waveforms. Other findings No abnormal free fluid. IMPRESSION: Negative pelvic ultrasound. No evidence of ovarian torsion. Electronically Signed   By: Charline Bills M.D.   On: 06/14/2016 15:46   US Pelvis Complete  Result Date: 06/14/2016 CLINICAL DATA:  Pelvic pain, history of C-section EXAM: TRANSABDOMINAL AND TRANSVAGINAL ULTRASOUND OF PELVIS DOPPLER ULTRASOUND OF OVARIES TECHNIQUE: Both transabdominal and transvaginal ultrasound examinations of the pelvis were performed. Transabdominal technique was performed for global imaging of the pelvis including uterus, ovaries, adnexal regions, and pelvic cul-de-sac. It was necessary to proceed with endovaginal exam following the transabdominal exam to  visualize the endometrium. Color and duplex Doppler ultrasound was utilized to evaluate blood flow to the ovaries. COMPARISON:  None. FINDINGS: Uterus Measurements: 6.9 x 3.7 x 4.5 cm. No fibroids or other mass visualized. Low anterior C-section scar. Endometrium Thickness: 6 mm.  No focal abnormality visualized. Right ovary Measurements: 2.8 x 2.4 x 2.5 cm. Normal appearance/no adnexal mass. Left ovary Measurements: 2.2 x 1.7 x 2.6 cm. Normal appearance/no adnexal mass. Pulsed Doppler evaluation of both ovaries demonstrates normal low-resistance arterial and venous waveforms. Other findings No abnormal free fluid. IMPRESSION: Negative pelvic ultrasound. No evidence of ovarian torsion. Electronically Signed   By: Charline Bills M.D.   On: 06/14/2016 15:46   [place] Art/ven Flow Abd Pelv Doppler  Result Date: 06/14/2016 CLINICAL DATA:  Pelvic pain, history of C-section EXAM: TRANSABDOMINAL AND TRANSVAGINAL ULTRASOUND OF PELVIS DOPPLER ULTRASOUND OF OVARIES TECHNIQUE: Both transabdominal and transvaginal ultrasound examinations of the pelvis were performed. Transabdominal technique was performed for global imaging of the pelvis including uterus, ovaries, adnexal regions, and pelvic cul-de-sac. It was necessary to proceed with endovaginal exam following the transabdominal exam to visualize the endometrium. Color and duplex Doppler ultrasound was utilized to evaluate blood flow to the ovaries. COMPARISON:  None. FINDINGS: Uterus Measurements: 6.9 x 3.7 x 4.5 cm. No fibroids or other mass visualized. Low anterior C-section scar. Endometrium Thickness: 6 mm.  No focal abnormality visualized. Right ovary Measurements: 2.8 x 2.4 x 2.5 cm. Normal appearance/no adnexal mass. Left ovary Measurements: 2.2 x 1.7 x 2.6 cm. Normal appearance/no adnexal mass. Pulsed Doppler evaluation of both ovaries demonstrates normal low-resistance arterial and venous waveforms. Other findings No abnormal free fluid. IMPRESSION: Negative  pelvic ultrasound. No evidence of ovarian torsion. Electronically Signed   By: Charline Bills M.D.   On: 06/14/2016 15:46   MAU Course  Procedures Morphine Phenergan  MDM Labs and [place] ordered and reviewed. Pt reports no improvement of pain but pt found to be sleeping when RN entered room. No evidence of anemia, PID, gyn pathology, or acute abdominal process. Pain likely dysmenorrhea. Stable for discharge home.   Assessment and Plan   1. Dysmenorrhea   2. Pelvic pain    Discharge home Follow up at [facility] in 2 weeks Rx Naprosyn Heating pad, warm baths prn pain  Allergies as of 06/14/2016      Reactions   Penicillins Anaphylaxis, Swelling   Has patient  had a PCN reaction causing immediate rash, facial/tongue/throat swelling, SOB or lightheadedness with hypotension: Yes Has patient had a PCN reaction causing severe rash involving mucus membranes or skin necrosis: No Has patient had a PCN reaction that required hospitalization Yes Has patient had a PCN reaction occurring within the last 10 years: No If all of the above answers are "NO", then may proceed with Cephalosporin use.   Tylenol [acetaminophen] Swelling   Sulfa Antibiotics Hives, Rash      Medication List    TAKE these medications   acetaminophen-codeine 300-30 MG tablet Commonly known as:  TYLENOL #3 Take 1-2 tablets by mouth every 6 (six) hours as needed for pain.   dicyclomine 20 MG tablet Commonly known as:  BENTYL Take 20 mg by mouth every 6 (six) hours.   metroNIDAZOLE 500 MG tablet Commonly known as:  FLAGYL Take 500 mg by mouth 3 (three) times daily.   naproxen 500 MG tablet Commonly known as:  NAPROSYN Take 1 tablet (500 mg total) by mouth 2 (two) times daily with a meal.      Donette Larry, CNM 06/14/2016, 1:53 PM

## 2016-06-14 NOTE — MAU Note (Signed)
Pt states she passed out 2 days ago, went to [facility], states she has uterine infection.   Is now hurting worse & is bleeding. Pt states she had temp of 104 this morning - took ibuprofen.  Has diarrhea, no vomiting.  Is still taking flagyl from her visit to [facility].

## 2017-03-13 IMAGING — CT CT PELVIS W/ CM
1 series · 15 of 32 positions shown, 19 images · IV contrast (OMNIPAQUE)
Comparison: 10/31/2015

CLINICAL DATA: Followup abscess drainage

EXAM:
CT PELVIS WITH CONTRAST
TECHNIQUE: Multidetector CT imaging of the pelvis was performed using the
standard protocol following the bolus administration of intravenous
contrast.
CONTRAST:  100mL 9OR86W-C22 IOPAMIDOL ([id number]-C22) INJECTION 61%

[Series 2: routine abdomen/pelvis with · axial · 0.89mm/px · z∈[-321,-61]mm · 15 of 58 slices shown, 19 images]
[im 4/58  soft-tissue]
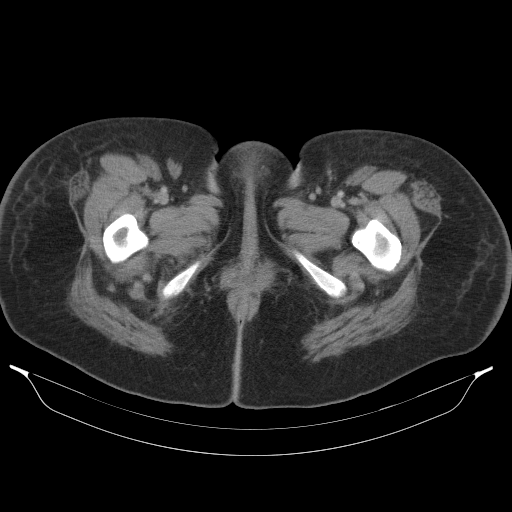
[im 4/58  bone]
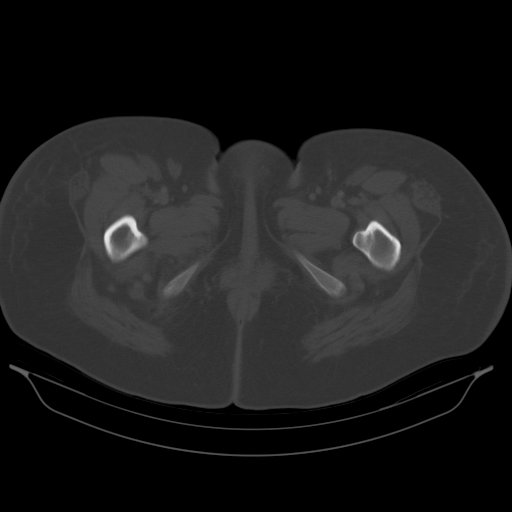
[im 8/58  soft-tissue]
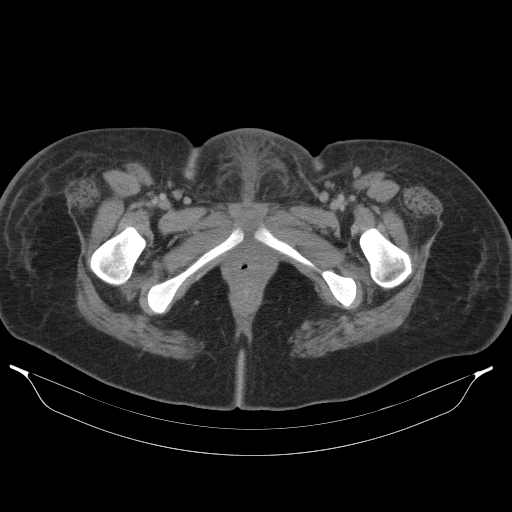
[im 12/58  soft-tissue]
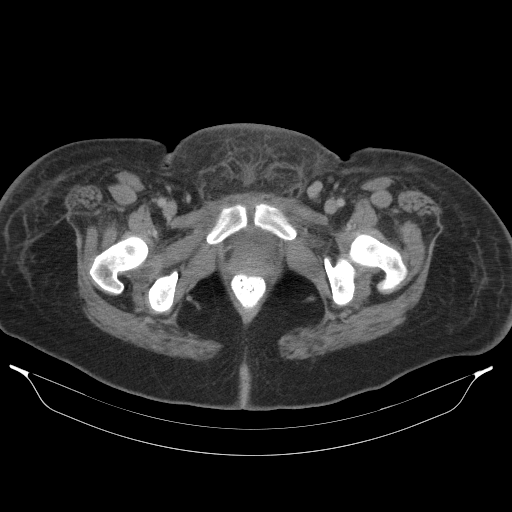
[im 17/58  soft-tissue]
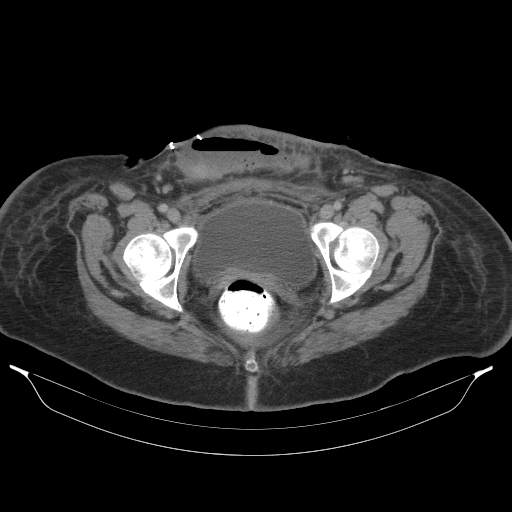
[im 21/58  soft-tissue]
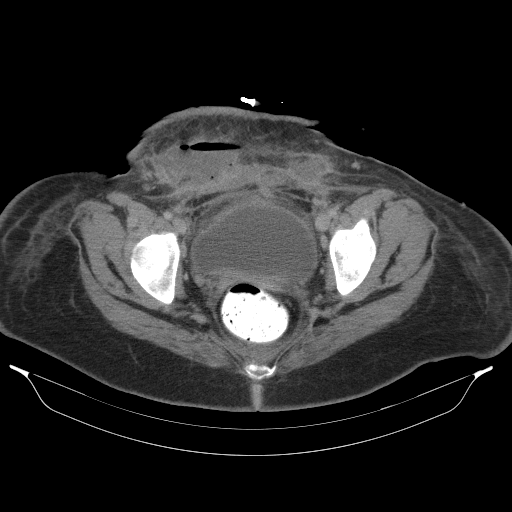
[im 24/58  soft-tissue]
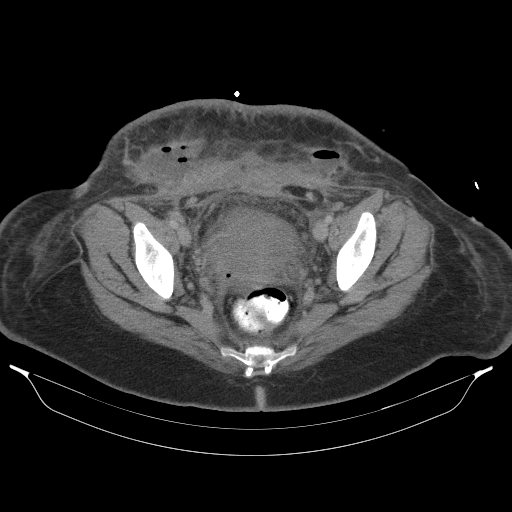
[im 30/58  soft-tissue]
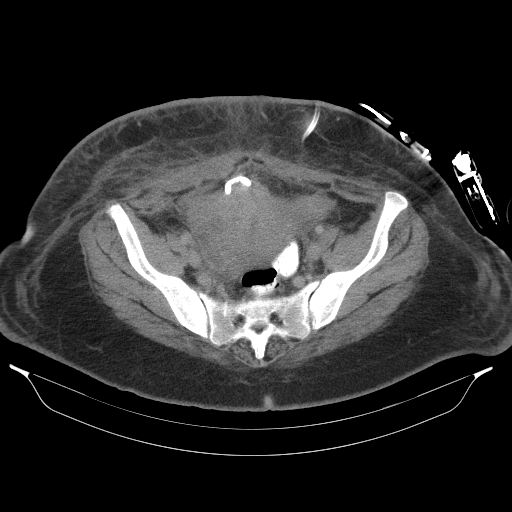
[im 34/58  soft-tissue]
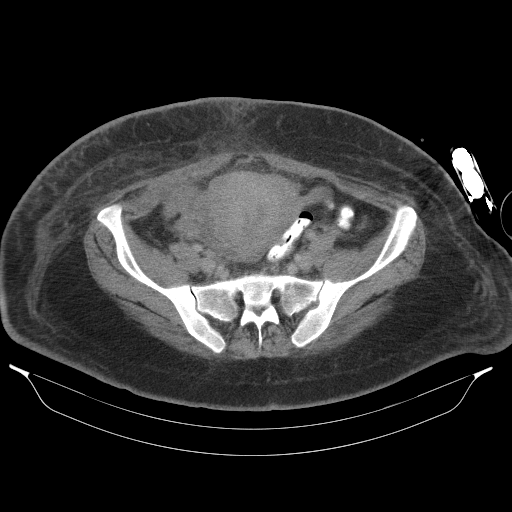
[im 37/58  soft-tissue]
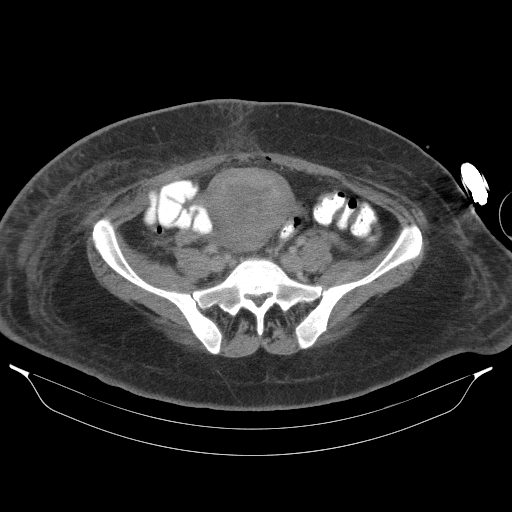
[im 37/58  bone]
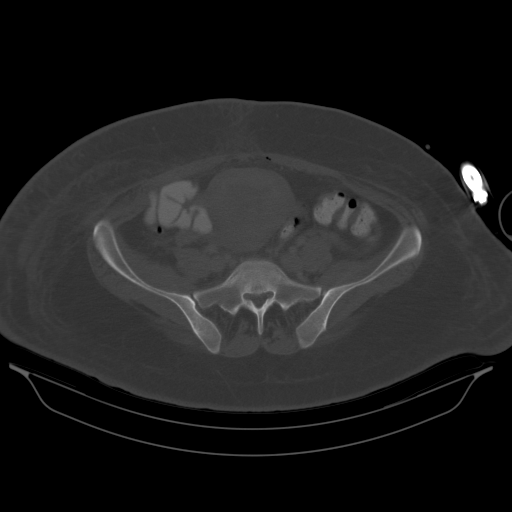
[im 41/58  soft-tissue]
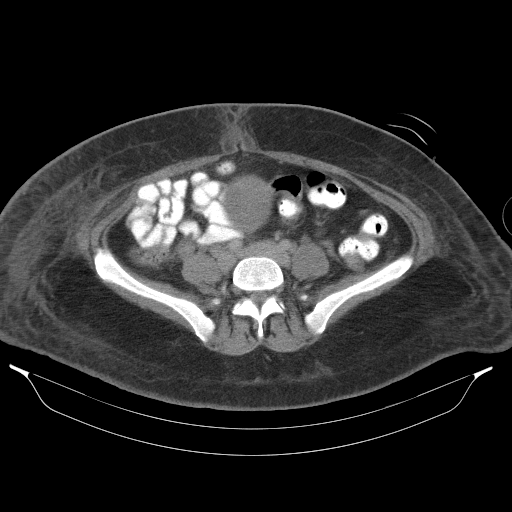
[im 46/58  soft-tissue]
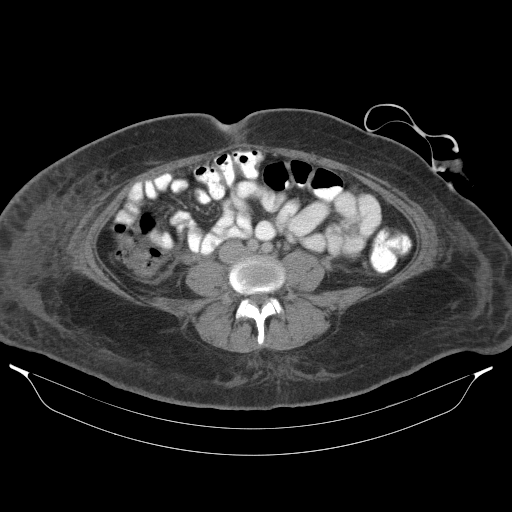
[im 50/58  soft-tissue]
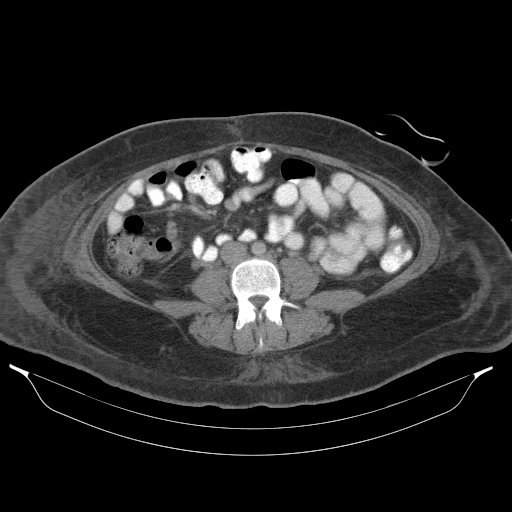
[im 50/58  lung]
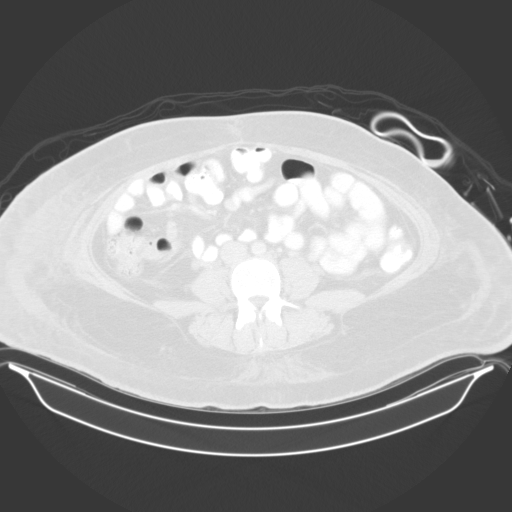
[im 52/58  lung]
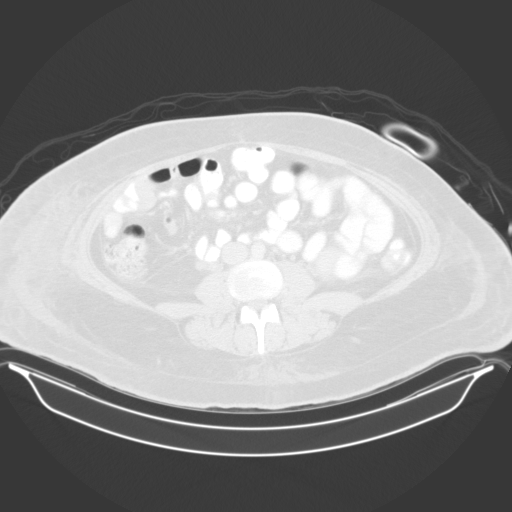
[im 54/58  soft-tissue]
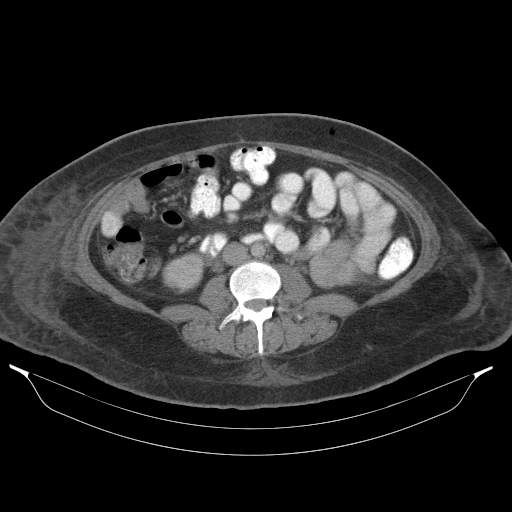
[im 54/58  lung]
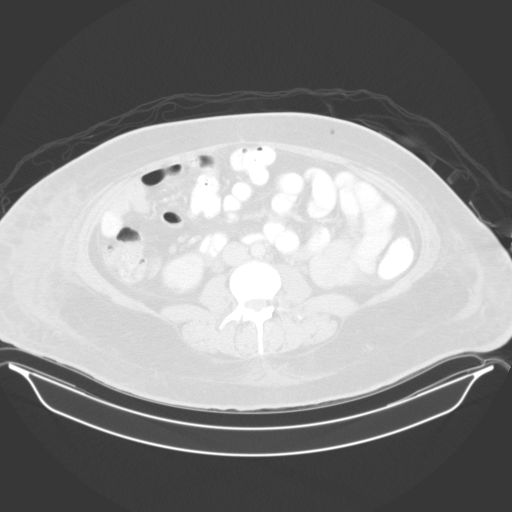
[im 56/58  lung]
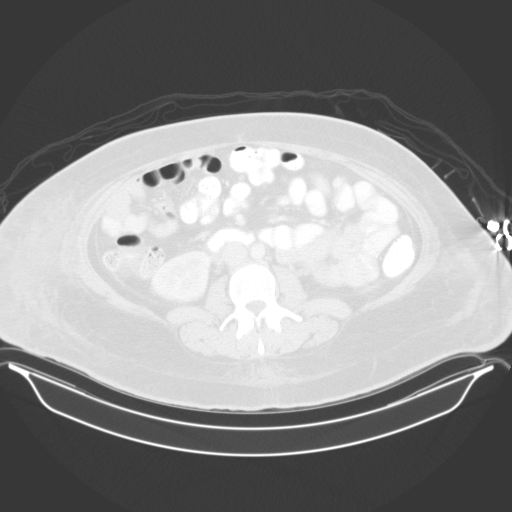

[15 of 32 positions shown; findings below may reference images not displayed]

FINDINGS: The tube pelvic abscess drains remain in place. The abscess
collections have resolved is other than a small rim of fluid about
the right side of the uterus.

Right lower quadrant subcutaneous abscess measures 8.2 x 3.6 cm and
previously measured 9.1 x 3.5 cm. This is slightly improved.

Left lower quadrant subcutaneous abscess measures 2.5 x 1.8 cm and
previously measured 3.1 x 2.1 cm. This has improved.

There is no new abscess. Bladder is mildly distended. Overlying
staples remain in place.
IMPRESSION: The intrapelvic abscess drains remain in place and the abscess
collections have virtually resolved. If output is minimal, the
drains can be removed.

Right lower quadrant and left lower quadrant subcutaneous abscesses
are improved as described. The right lower quadrant subcutaneous
absence remains sizable at 8.2 x 3.6 cm.

## 2017-10-22 IMAGING — US US ART/VEN ABD/PELV/SCROTUM DOPPLER LTD
1 series · 15 of 25 positions shown · non-contrast
Comparison: None.

CLINICAL DATA: Pelvic pain, history of C-section

EXAM:
TRANSABDOMINAL AND TRANSVAGINAL ULTRASOUND OF PELVIS
DOPPLER ULTRASOUND OF OVARIES
TECHNIQUE: Both transabdominal and transvaginal ultrasound examinations of the
pelvis were performed. Transabdominal technique was performed for
global imaging of the pelvis including uterus, ovaries, adnexal
regions, and pelvic cul-de-sac.
It was necessary to proceed with endovaginal exam following the
transabdominal exam to visualize the endometrium. Color and duplex
Doppler ultrasound was utilized to evaluate blood flow to the
ovaries.

[Series 1: us art/ven abd/pelv/scrotum doppler ltd · 15 of 91 slices shown]
[im 1/91]
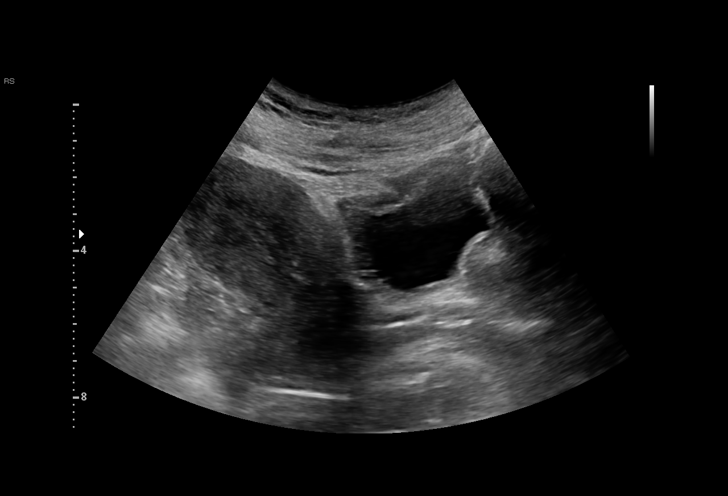
[im 8/91]
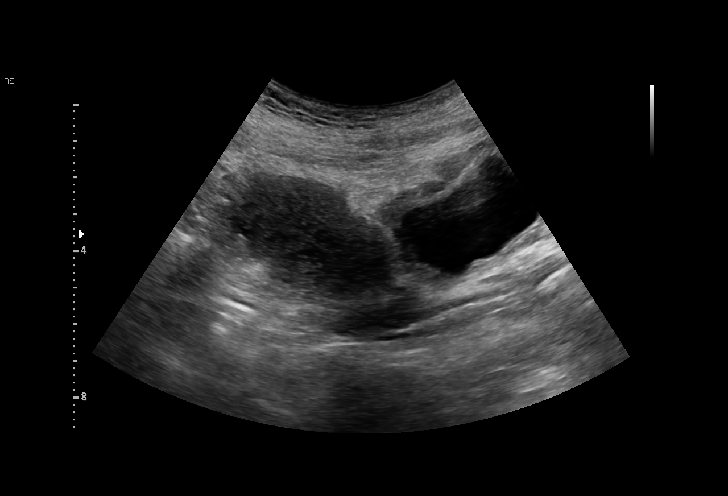
[im 16/91]
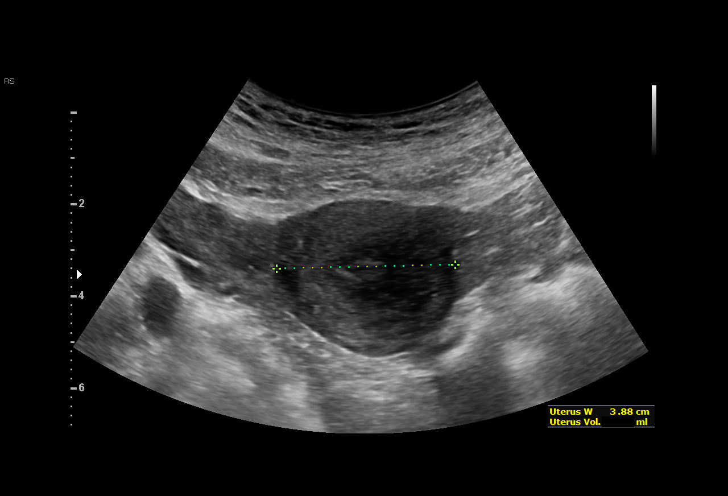
[im 19/91]
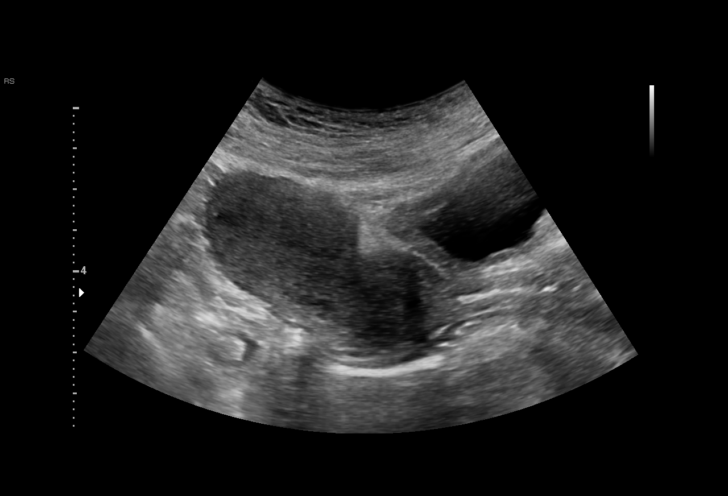
[im 27/91]
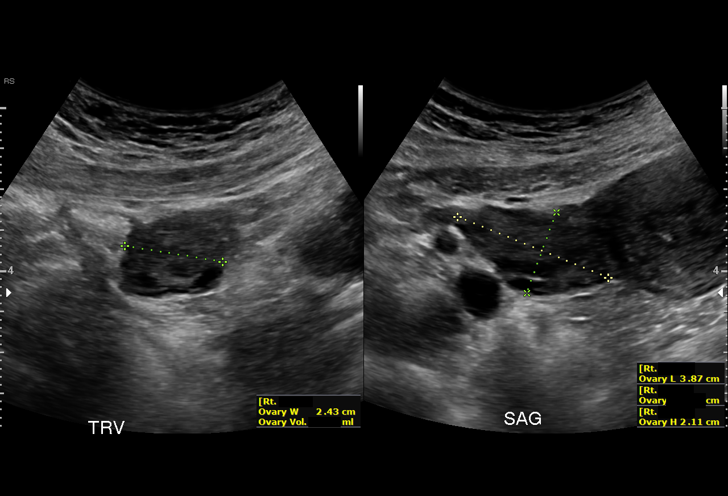
[im 34/91]
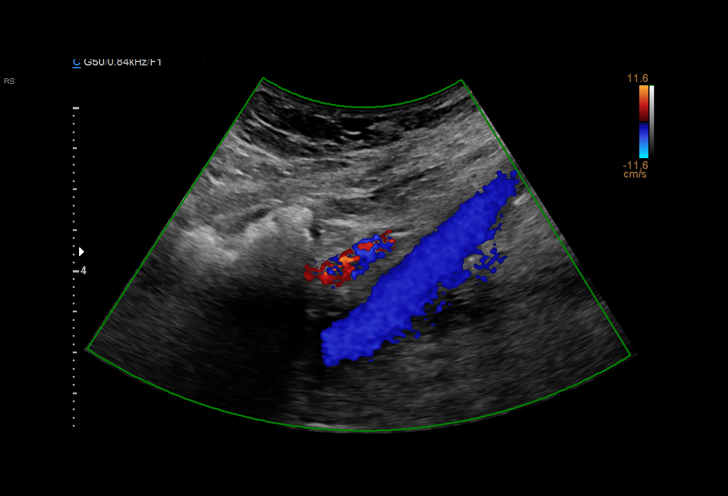
[im 38/91]
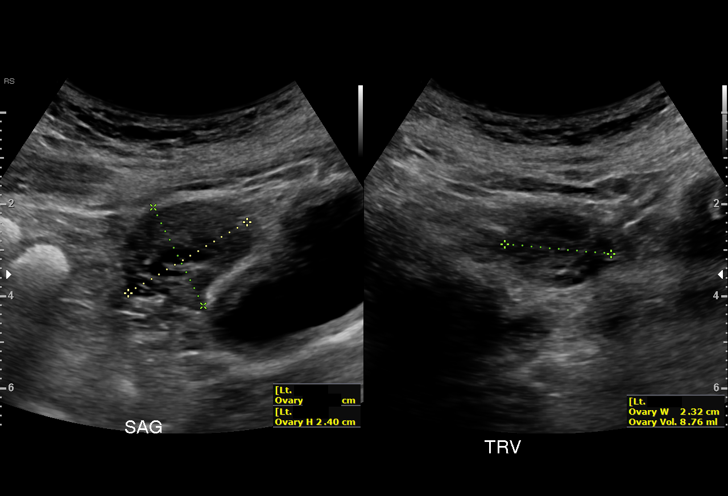
[im 46/91]
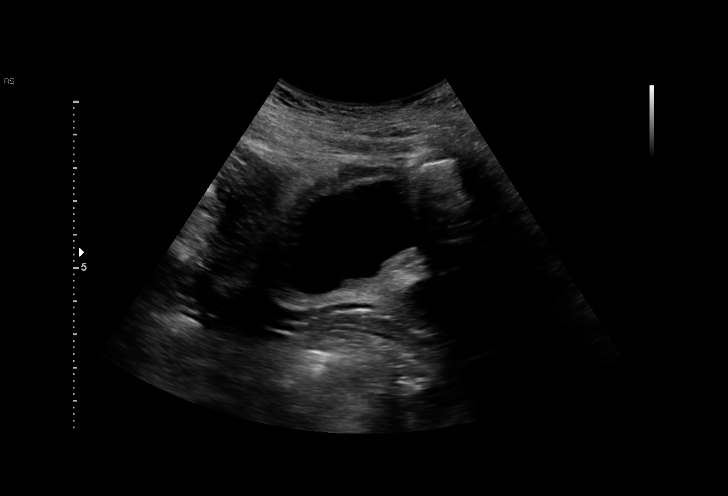
[im 53/91]
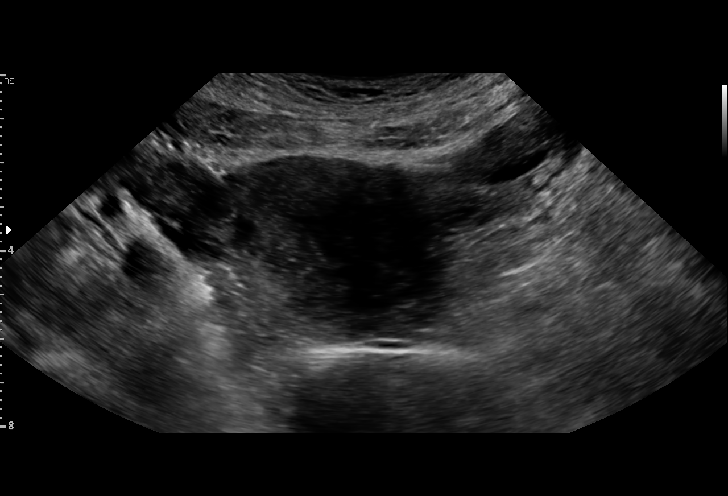
[im 57/91]
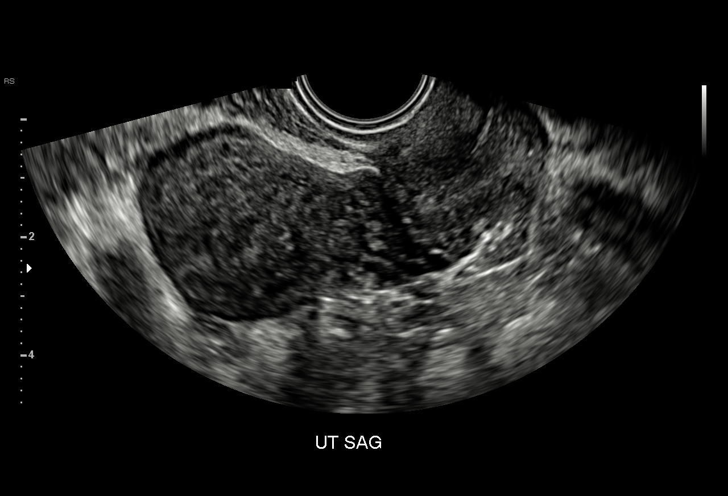
[im 64/91]
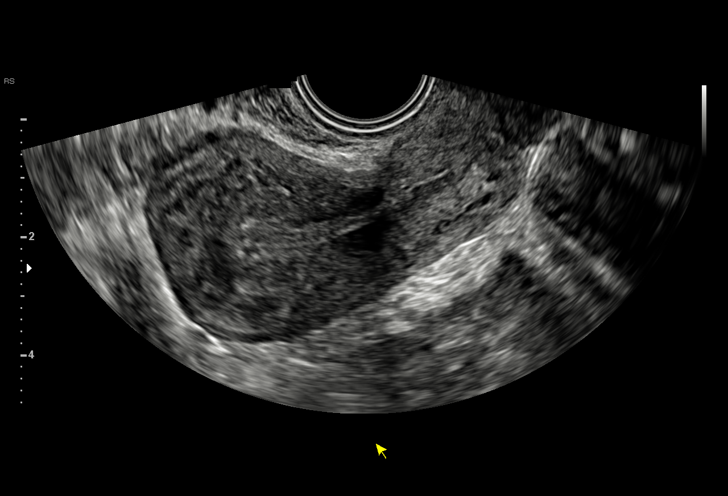
[im 72/91]
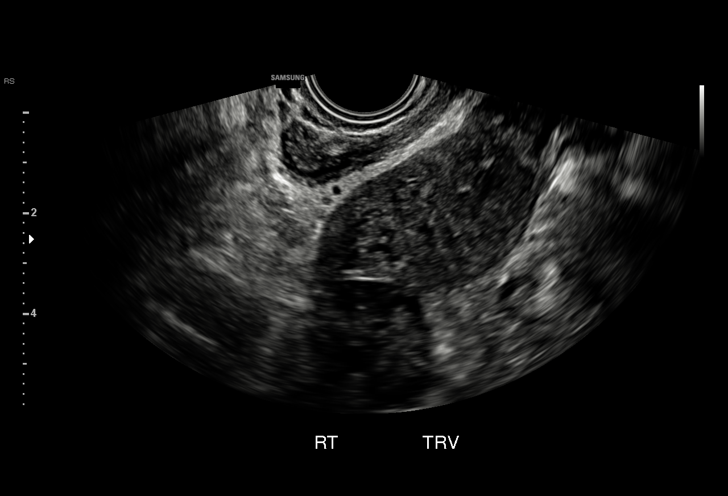
[im 76/91]
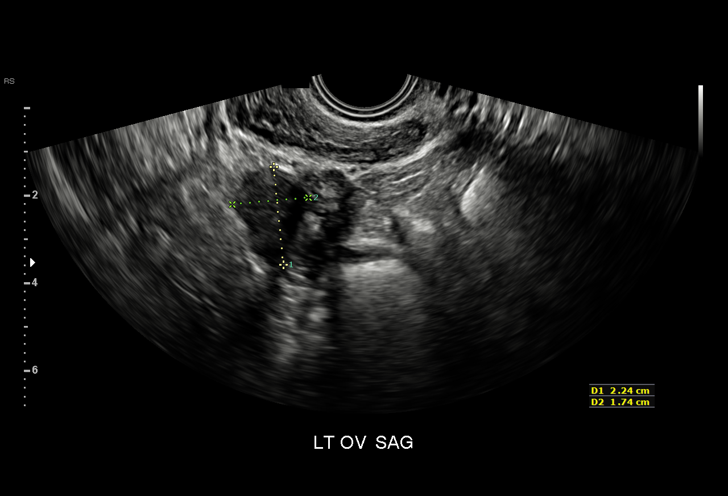
[im 83/91]
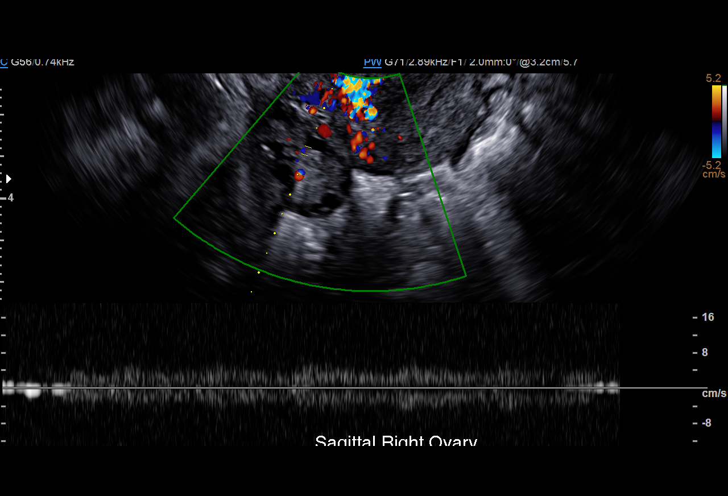
[im 91/91]
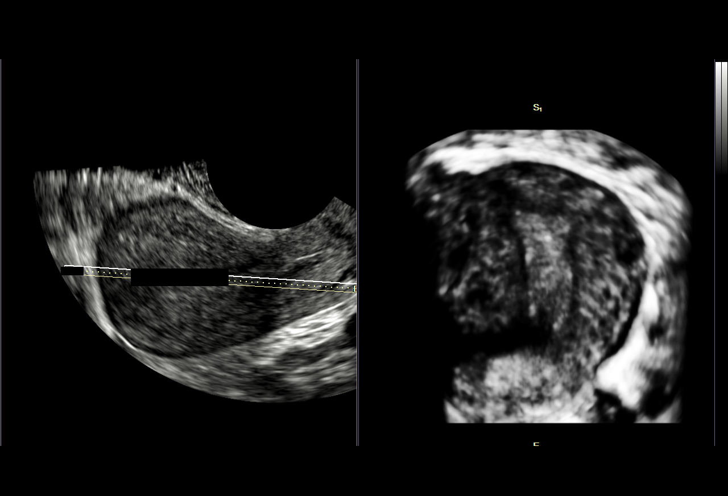

[15 of 25 positions shown; findings below may reference images not displayed]

FINDINGS: Uterus

Measurements: 6.9 x 3.7 x 4.5 cm. No fibroids or other mass
visualized. Low anterior C-section scar.

Endometrium

Thickness: 6 mm.  No focal abnormality visualized.

Right ovary

Measurements: 2.8 x 2.4 x 2.5 cm. Normal appearance/no adnexal mass.

Left ovary

Measurements: 2.2 x 1.7 x 2.6 cm. Normal appearance/no adnexal mass.

Pulsed Doppler evaluation of both ovaries demonstrates normal
low-resistance arterial and venous waveforms.

Other findings

No abnormal free fluid.
IMPRESSION: Negative pelvic ultrasound.

No evidence of ovarian torsion.

## 2022-03-01 ENCOUNTER — Telehealth: Payer: Self-pay | Admitting: Oncology

## 2022-03-01 ENCOUNTER — Inpatient Hospital Stay: Payer: Medicaid Other | Attending: Oncology | Admitting: Oncology

## 2022-03-01 ENCOUNTER — Encounter: Payer: Self-pay | Admitting: Oncology

## 2022-03-01 ENCOUNTER — Inpatient Hospital Stay: Payer: Medicaid Other

## 2022-03-01 VITALS — BP 113/72 | HR 79 | Temp 98.4°F | Resp 16 | Ht 63.7 in | Wt 208.1 lb

## 2022-03-01 DIAGNOSIS — F17210 Nicotine dependence, cigarettes, uncomplicated: Secondary | ICD-10-CM | POA: Diagnosis not present

## 2022-03-01 DIAGNOSIS — Z8616 Personal history of COVID-19: Secondary | ICD-10-CM | POA: Diagnosis not present

## 2022-03-01 DIAGNOSIS — R778 Other specified abnormalities of plasma proteins: Secondary | ICD-10-CM

## 2022-03-01 DIAGNOSIS — Z3490 Encounter for supervision of normal pregnancy, unspecified, unspecified trimester: Secondary | ICD-10-CM | POA: Insufficient documentation

## 2022-03-01 DIAGNOSIS — Z808 Family history of malignant neoplasm of other organs or systems: Secondary | ICD-10-CM

## 2022-03-01 DIAGNOSIS — Z3A14 14 weeks gestation of pregnancy: Secondary | ICD-10-CM

## 2022-03-01 DIAGNOSIS — E538 Deficiency of other specified B group vitamins: Secondary | ICD-10-CM | POA: Diagnosis not present

## 2022-03-01 DIAGNOSIS — N921 Excessive and frequent menstruation with irregular cycle: Secondary | ICD-10-CM

## 2022-03-01 DIAGNOSIS — N920 Excessive and frequent menstruation with regular cycle: Secondary | ICD-10-CM

## 2022-03-01 LAB — COMPREHENSIVE METABOLIC PANEL
ALT: 15 U/L (ref 0–44)
AST: 18 U/L (ref 15–41)
Albumin: 3.6 g/dL (ref 3.5–5.0)
Alkaline Phosphatase: 51 U/L (ref 38–126)
Anion gap: 9 (ref 5–15)
BUN: 5 mg/dL — ABNORMAL LOW (ref 6–20)
CO2: 22 mmol/L (ref 22–32)
Calcium: 8.8 mg/dL — ABNORMAL LOW (ref 8.9–10.3)
Chloride: 105 mmol/L (ref 98–111)
Creatinine, Ser: 0.37 mg/dL — ABNORMAL LOW (ref 0.44–1.00)
GFR, Estimated: >60 mL/min (ref >60)
Glucose, Bld: 80 mg/dL (ref 70–99)
Potassium: 3.9 mmol/L (ref 3.5–5.1)
Sodium: 136 mmol/L (ref 135–145)
Total Bilirubin: 0.4 mg/dL (ref 0.3–1.2)
Total Protein: 7.3 g/dL (ref 6.5–8.1)

## 2022-03-01 LAB — CBC WITH DIFFERENTIAL/PLATELET
Abs Immature Granulocytes: 0.04 10*3/uL (ref 0.00–0.07)
Basophils Absolute: 0 10*3/uL (ref 0.0–0.1)
Basophils Relative: 0 %
Eosinophils Absolute: 0.1 10*3/uL (ref 0.0–0.5)
Eosinophils Relative: 1 %
HCT: 36.4 % (ref 36.0–46.0)
Hemoglobin: 12.3 g/dL (ref 12.0–15.0)
Immature Granulocytes: 1 %
Lymphocytes Relative: 21 %
Lymphs Abs: 1.4 10*3/uL (ref 0.7–4.0)
MCH: 30.8 pg (ref 26.0–34.0)
MCHC: 33.8 g/dL (ref 30.0–36.0)
MCV: 91 fL (ref 80.0–100.0)
Monocytes Absolute: 0.3 10*3/uL (ref 0.1–1.0)
Monocytes Relative: 5 %
Neutro Abs: 4.9 10*3/uL (ref 1.7–7.7)
Neutrophils Relative %: 72 %
Platelets: 300 10*3/uL (ref 150–400)
RBC: 4 MIL/uL (ref 3.87–5.11)
RDW: 12.3 % (ref 11.5–15.5)
WBC: 6.7 10*3/uL (ref 4.0–10.5)
nRBC: 0 % (ref 0.0–0.2)

## 2022-03-01 LAB — VITAMIN B12: Vitamin B-12: 162 pg/mL — ABNORMAL LOW (ref 180–914)

## 2022-03-01 LAB — PROTIME-INR
INR: 1 (ref 0.8–1.2)
Prothrombin Time: 13.2 seconds (ref 11.4–15.2)

## 2022-03-01 LAB — FERRITIN: Ferritin: 90 ng/mL (ref 11–307)

## 2022-03-01 LAB — APTT: aPTT: 31 seconds (ref 24–36)

## 2022-03-01 LAB — FOLATE: Folate: 25.9 ng/mL (ref >5.9)

## 2022-03-01 LAB — FIBRINOGEN: Fibrinogen: 436 mg/dL (ref 210–475)

## 2022-03-01 NOTE — Telephone Encounter (Signed)
Patient has been scheduled for follow-up visit per 03/01/22 LOS.  Pt given an appt calendar with date and time.   

## 2022-03-01 NOTE — Progress Notes (Signed)
[name] Initial Visit:  Patient Care Team: Patient, No Pcp Per as PCP - General (General Practice)  CHIEF COMPLAINTS/PURPOSE OF CONSULTATION:  Oncology History   No history exists.    HISTORY OF PRESENTING ILLNESS: Raven [name] 27 y.o. female is here because of hemoglobin electrophoresis Medical history notable for hypothyroidism, PTSD, scoliosis, cesarean section, polysubstance abuse (in remission)  January 26, 2022 hemoglobin electrophoresis showed hemoglobin A 96.9 hemoglobin A2 3.1  (No abnormal hemoglobins detected) WBC 8.1 hemoglobin 12.9 MCV 90.5 count 303 HIV negative.  RPR negative  March 01, 2022:  [facility] Hematology Consult Patient is G3 P2-0-0-2 with estimated date of delivery August 31, 2022 Currently at 14 weeks.  Delivered first pregnancy by NSVD and second pregnancy by C section both without history of hemorrhage.  She needed reoperation to manage an infected wound site from the C section.  No history of blood transfusion.  No history of requiring oral iron and has not required IV iron.  Menses were irregular prior to this pregnancy and often heavy.  No bleeding between periods.  Taking PNV's Not taking ASA or other NSAIDS.  Uses tylenol for HA  Social:  Not working currently.  Smokes 1 to 2 cigarettes daily.  No longer vaping because it makes her nauseated.  EtOH none  [facility] Mother alive 46 thyroid cancer Father alive 34's high cholesterol Brother alive 50 asthma PGM alive 98 easy bruising.    Review of Systems  HENT:   Negative for mouth sores and nosebleeds.        No gingival bleeding  Eyes:  Negative for eye problems and icterus.  Respiratory:  Negative for cough and hemoptysis.        SOB this week because recovering from COVID  Gastrointestinal:  Positive for diarrhea and nausea. Negative for blood in stool and constipation.  Genitourinary:  Negative for dysuria and hematuria.        Urinary frequency with pregnancy     MEDICAL HISTORY: Past Medical History:  Diagnosis Date   Anxiety    Depression    Hypothyroidism    PTSD (post-traumatic stress disorder)    Scoliosis     SURGICAL HISTORY: Past Surgical History:  Procedure Laterality Date   CESAREAN SECTION     INCISION AND DRAINAGE ABSCESS Bilateral 11/07/2015   Procedure: INCISION AND DRAINAGE ABSCESS;  Surgeon: Tilda Burrow, MD;  Location: [facility];  Service: Gynecology;  Laterality: Bilateral;  c/section incision site   TONSILLECTOMY     WOUND EXPLORATION N/A 11/02/2015   Procedure: WOUND EXPLORATION;  Surgeon: Willodean Rosenthal, MD;  Location: [facility];  Service: Gynecology;  Laterality: N/A;    SOCIAL HISTORY: Social History   Socioeconomic History   Marital status: Single    Spouse name: Not on file   Number of children: Not on file   Years of education: Not on file   Highest education level: Not on file  Occupational History   Not on file  Tobacco Use   Smoking status: Every Day    Types: Cigarettes   Smokeless tobacco: Never   Tobacco comments:    1 a day  Vaping Use   Vaping Use: Former  Substance and Sexual Activity   Alcohol use: No   Drug use: No   Sexual activity: Never    Birth control/protection: None  Other Topics Concern   Not on file  Social History Narrative   Not on file   Social Determinants of  Health   Financial Resource Strain: Not on file  Food Insecurity: Not on file  Transportation Needs: Not on file  Physical Activity: Not on file  Stress: Not on file  Social Connections: Not on file  Intimate Partner Violence: Not on file    FAMILY HISTORY Family History  Problem Relation Age of Onset   Heart disease Mother    Thyroid cancer Mother    Hyperlipidemia Father    Hyperlipidemia Maternal Grandmother    Clotting disorder Paternal Grandfather     ALLERGIES:  is allergic to penicillins, tylenol [acetaminophen], and sulfa antibiotics.  MEDICATIONS:  Current Outpatient Medications   Medication Sig Dispense Refill   Prenatal Multivit-Min-Fe-FA (PRENATAL 1 + IRON PO) Take by mouth.     No current facility-administered medications for this visit.    PHYSICAL EXAMINATION:  ECOG PERFORMANCE STATUS: 0 - Asymptomatic   Vitals:   03/01/22 1122  BP: 113/72  Pulse: 79  Resp: 16  Temp: 98.4 F (36.9 C)  SpO2: 100%    Filed Weights   03/01/22 1122  Weight: 208 lb 1.6 oz (94.4 kg)     Physical Exam Vitals and nursing note reviewed.  Constitutional:      Appearance: Normal appearance. She is obese. She is not toxic-appearing or diaphoretic.  HENT:     Head: Normocephalic and atraumatic.     Right Ear: External ear normal.     Left Ear: External ear normal.     Nose: Nose normal. No congestion or rhinorrhea.  Eyes:     General: No scleral icterus.    Extraocular Movements: Extraocular movements intact.     Conjunctiva/sclera: Conjunctivae normal.     Pupils: Pupils are equal, round, and reactive to light.  Cardiovascular:     Rate and Rhythm: Normal rate.     Heart sounds: No murmur heard.    No friction rub. No gallop.  Pulmonary:     Effort: Pulmonary effort is normal. No respiratory distress.     Breath sounds: Normal breath sounds. No wheezing.  Abdominal:     General: Bowel sounds are normal.     Palpations: Abdomen is soft.     Tenderness: There is no abdominal tenderness. There is no guarding.  Musculoskeletal:        General: No swelling, tenderness or deformity.     Cervical back: Normal range of motion and neck supple. No rigidity or tenderness.  Lymphadenopathy:     Head:     Right side of head: No submental, submandibular, tonsillar, preauricular, posterior auricular or occipital adenopathy.     Left side of head: No submental, submandibular, tonsillar, preauricular, posterior auricular or occipital adenopathy.     Cervical: No cervical adenopathy.     Right cervical: No superficial, deep or posterior cervical adenopathy.    Left  cervical: No superficial, deep or posterior cervical adenopathy.     Upper Body:     Right upper body: No supraclavicular, axillary, pectoral or epitrochlear adenopathy.     Left upper body: No supraclavicular, axillary, pectoral or epitrochlear adenopathy.  Skin:    General: Skin is warm.     Coloration: Skin is not jaundiced or pale.     Findings: No bruising or lesion.  Neurological:     General: No focal deficit present.     Mental Status: She is alert and oriented to person, place, and time.     Cranial Nerves: No cranial nerve deficit.  Psychiatric:  Mood and Affect: Mood normal.        Behavior: Behavior normal.        Thought Content: Thought content normal.        Judgment: Judgment normal.      LABORATORY DATA: I have personally reviewed the data as listed:  No visits with results within 1 Month(s) from this visit.  Latest known visit with results is:  Admission on 06/14/2016, Discharged on 06/14/2016  Component Date Value Ref Range Status   Color, Urine 06/14/2016 YELLOW  YELLOW Final   APPearance 06/14/2016 CLEAR  CLEAR Final   Specific Gravity, Urine 06/14/2016 1.021  1.005 - 1.030 Final   pH 06/14/2016 7.0  5.0 - 8.0 Final   Glucose, UA 06/14/2016 NEGATIVE  NEGATIVE mg/dL Final   Hgb urine dipstick 06/14/2016 LARGE (A)  NEGATIVE Final   Bilirubin Urine 06/14/2016 NEGATIVE  NEGATIVE Final   Ketones, ur 06/14/2016 NEGATIVE  NEGATIVE mg/dL Final   Protein, ur 06/14/2016 NEGATIVE  NEGATIVE mg/dL Final   Nitrite 06/14/2016 NEGATIVE  NEGATIVE Final   Leukocytes, UA 06/14/2016 TRACE (A)  NEGATIVE Final   RBC / HPF 06/14/2016 0-5  0 - 5 RBC/hpf Final   WBC, UA 06/14/2016 6-30  0 - 5 WBC/hpf Final   Bacteria, UA 06/14/2016 RARE (A)  NONE SEEN Final   Squamous Epithelial / LPF 06/14/2016 6-30 (A)  NONE SEEN Final   Mucus 06/14/2016 PRESENT   Final   Preg Test, Ur 06/14/2016 NEGATIVE  NEGATIVE Final   Comment:        THE SENSITIVITY OF THIS METHODOLOGY IS >24  mIU/mL    Yeast Wet Prep HPF POC 06/14/2016 NONE SEEN  NONE SEEN Final   Trich, Wet Prep 06/14/2016 NONE SEEN  NONE SEEN Final   Clue Cells Wet Prep HPF POC 06/14/2016 NONE SEEN  NONE SEEN Final   WBC, Wet Prep HPF POC 06/14/2016 FEW (A)  NONE SEEN Final   Sperm 06/14/2016 NONE SEEN   Final   WBC 06/14/2016 6.3  4.0 - 10.5 K/uL Final   RBC 06/14/2016 4.44  3.87 - 5.11 MIL/uL Final   Hemoglobin 06/14/2016 13.5  12.0 - 15.0 g/dL Final   HCT 06/14/2016 38.5  36.0 - 46.0 % Final   MCV 06/14/2016 86.7  78.0 - 100.0 fL Final   MCH 06/14/2016 30.4  26.0 - 34.0 pg Final   MCHC 06/14/2016 35.1  30.0 - 36.0 g/dL Final   RDW 06/14/2016 12.7  11.5 - 15.5 % Final   Platelets 06/14/2016 229  150 - 400 K/uL Final   Neutrophils Relative % 06/14/2016 65  % Final   Neutro Abs 06/14/2016 4.1  1.7 - 7.7 K/uL Final   Lymphocytes Relative 06/14/2016 31  % Final   Lymphs Abs 06/14/2016 1.9  0.7 - 4.0 K/uL Final   Monocytes Relative 06/14/2016 3  % Final   Monocytes Absolute 06/14/2016 0.2  0.1 - 1.0 K/uL Final   Eosinophils Relative 06/14/2016 1  % Final   Eosinophils Absolute 06/14/2016 0.0  0.0 - 0.7 K/uL Final   Basophils Relative 06/14/2016 0  % Final   Basophils Absolute 06/14/2016 0.0  0.0 - 0.1 K/uL Final    RADIOGRAPHIC STUDIES: I have personally reviewed the radiological images as listed and agree with the findings in the report  No results found.  ASSESSMENT/PLAN  27 y.o. female G3 P2-0-0-2 with estimated date of delivery August 31, 2022.  Medical history notable for hypothyroidism, PTSD, scoliosis, cesarean section, polysubstance  abuse (in remission).  Patient has history of menorrhagia.  In the course of evaluating patient during this pregnancy Hgb electrophoresis was notable for Hgb A2 of 3.1  Hemoglobin electrophoresis:  Obtained during the course of prenatal evaluation.  Normal.  The Hgb A2 of 3.1 does not constitute an abnormality  Intrauterine pregnancy with history of menorrhagia:  Will  obtain CBC with diff, ferritin B12, folate to make certain nutritional stores are adequate.  Obtaining PT/PTT/Fibrinogen for coagulation screen    Cancer Staging  No matching staging information was found for the patient.   No problem-specific Assessment & Plan notes found for this encounter.   Orders Placed This Encounter  Procedures   Vitamin B12    Standing Status:   Future    Number of Occurrences:   1    Standing Expiration Date:   03/02/2023   Ferritin    Standing Status:   Future    Number of Occurrences:   1    Standing Expiration Date:   03/02/2023   Folate    Standing Status:   Future    Number of Occurrences:   1    Standing Expiration Date:   03/02/2023   APTT    Standing Status:   Future    Number of Occurrences:   1    Standing Expiration Date:   03/02/2023   Protime-INR    Standing Status:   Future    Number of Occurrences:   1    Standing Expiration Date:   03/02/2023   Fibrinogen    Standing Status:   Future    Number of Occurrences:   1    Standing Expiration Date:   03/02/2023   CBC with Differential/Platelet   Comprehensive metabolic panel    48 minutes was spent in patient care.  This included time spent preparing to see the patient (e.g., review of tests), obtaining and/or reviewing separately obtained history, counseling and educating the patient/family/caregiver, ordering medications, tests, or procedures; documenting clinical information in the electronic or other health record, independently interpreting results and communicating results to the patient/family/caregiver as well as coordination of care.      All questions were answered. The patient knows to call the clinic with any problems, questions or concerns.  This note was electronically signed.    Loni Muse, MD  03/01/2022 4:03 PM

## 2022-03-02 ENCOUNTER — Encounter: Payer: Self-pay | Admitting: Oncology

## 2022-03-02 NOTE — Addendum Note (Signed)
Addended by: Wanita Chamberlain on: 03/02/2022 08:08 AM   Modules accepted: Orders

## 2022-03-06 ENCOUNTER — Telehealth: Payer: Self-pay

## 2022-03-06 NOTE — Telephone Encounter (Signed)
Patient has been advised of results and that she will be contacted to have B12 Injections set up.  Barbee Cough, MD--  Aleatha Borer, CMA Patient is deficient in B12.  Please inform her and schedule injections Thanks

## 2022-03-08 ENCOUNTER — Telehealth: Payer: Self-pay | Admitting: Oncology

## 2022-03-08 ENCOUNTER — Inpatient Hospital Stay (INDEPENDENT_AMBULATORY_CARE_PROVIDER_SITE_OTHER): Payer: Medicaid Other | Admitting: Oncology

## 2022-03-08 VITALS — BP 128/81 | HR 85 | Temp 98.4°F | Resp 16 | Ht 63.7 in | Wt 209.0 lb

## 2022-03-08 DIAGNOSIS — Z3A14 14 weeks gestation of pregnancy: Secondary | ICD-10-CM | POA: Diagnosis not present

## 2022-03-08 DIAGNOSIS — E538 Deficiency of other specified B group vitamins: Secondary | ICD-10-CM | POA: Diagnosis not present

## 2022-03-08 NOTE — Telephone Encounter (Signed)
Patient has been scheduled for follow-up visit per 03/08/22 LOS.  Pt given an appt calendar with date and time.  

## 2022-03-08 NOTE — Progress Notes (Signed)
[facility] Initial Visit:  Patient Care Team: Patient, No Pcp Per as PCP - General (General Practice)  CHIEF COMPLAINTS/PURPOSE OF CONSULTATION:  Oncology History   No history exists.    HISTORY OF PRESENTING ILLNESS: [facility] 27 y.o. female is here because of hemoglobin electrophoresis Medical history notable for hypothyroidism, PTSD, scoliosis, cesarean section, polysubstance abuse (in remission)  January 26, 2022 hemoglobin electrophoresis showed hemoglobin A 96.9 hemoglobin A2 3.1  (No abnormal hemoglobins detected) WBC 8.1 hemoglobin 12.9 MCV 90.5 count 303 HIV negative.  RPR negative  March 01, 2022:  [facility] Patient is G3 P2-0-0-2 with estimated date of delivery August 31, 2022 Currently at 14 weeks.  Delivered first pregnancy by NSVD and second pregnancy by C section both without history of hemorrhage.  She needed reoperation to manage an infected wound site from the C section.  No history of blood transfusion.  No history of requiring oral iron and has not required IV iron.  Menses were irregular prior to this pregnancy and often heavy.  No bleeding between periods.  Taking PNV's Not taking ASA or other NSAIDS.  Uses tylenol for HA  Social:  Not working currently.  Smokes 1 to 2 cigarettes daily.  No longer vaping because it makes her nauseated.  EtOH none  [facility] Mother alive 31 thyroid cancer Father alive 26's high cholesterol Brother alive 44 asthma PGM alive 98 easy bruising.    WBC 6.7 hemoglobin 12.3 MCV 91 platelet count 300; 72 segs 31 lymphs 5 monos 1 EO INR 1.0 PTT 31 fibrinogen 436  Ferritin 90 folate 25.9 B12 162 CMP notable for BUN 5 creatinine 0.37 calcium 8.8 albumin 3.6  March 08, 2022: Scheduled follow-up because of history of hemoglobin electrophoresis.  Patient found to have vitamin B12 deficiency.  Fatigued.  Not sleeping well.  Anorectic in morning but appetite improves by the evening.     March 09 2022:  To start parenteral B12 replacement  Review of Systems  HENT:   Negative for mouth sores and nosebleeds.        No gingival bleeding  Eyes:  Negative for eye problems and icterus.  Respiratory:  Negative for cough and hemoptysis.        SOB this week because recovering from COVID  Gastrointestinal:  Positive for diarrhea and nausea. Negative for blood in stool and constipation.  Genitourinary:  Negative for dysuria and hematuria.        Urinary frequency with pregnancy    MEDICAL HISTORY: Past Medical History:  Diagnosis Date   Anxiety    Depression    Hypothyroidism    PTSD (post-traumatic stress disorder)    Scoliosis     SURGICAL HISTORY: Past Surgical History:  Procedure Laterality Date   CESAREAN SECTION     INCISION AND DRAINAGE ABSCESS Bilateral 11/07/2015   Procedure: INCISION AND DRAINAGE ABSCESS;  Surgeon: Jonnie Kind, MD;  Location: [facility];  Service: Gynecology;  Laterality: Bilateral;  c/section incision site   TONSILLECTOMY     WOUND EXPLORATION N/A 11/02/2015   Procedure: WOUND EXPLORATION;  Surgeon: Lavonia Drafts, MD;  Location: [facility];  Service: Gynecology;  Laterality: N/A;    SOCIAL HISTORY: Social History   Socioeconomic History   Marital status: Single    Spouse name: Not on file   Number of children: Not on file   Years of education: Not on file   Highest education level: Not on file  Occupational History  Not on file  Tobacco Use   Smoking status: Every Day    Types: Cigarettes   Smokeless tobacco: Never   Tobacco comments:    1 a day  Vaping Use   Vaping Use: Former  Substance and Sexual Activity   Alcohol use: No   Drug use: No   Sexual activity: Never    Birth control/protection: None  Other Topics Concern   Not on file  Social History Narrative   Not on file   Social Determinants of Health   Financial Resource Strain: Not on file  Food Insecurity: Not on file  Transportation Needs: Not on  file  Physical Activity: Not on file  Stress: Not on file  Social Connections: Not on file  Intimate Partner Violence: Not on file    FAMILY HISTORY Family History  Problem Relation Age of Onset   Heart disease Mother    Thyroid cancer Mother    Hyperlipidemia Father    Hyperlipidemia Maternal Grandmother    Clotting disorder Paternal Grandfather     ALLERGIES:  is allergic to penicillins, tylenol [acetaminophen], and sulfa antibiotics.  MEDICATIONS:  Current Outpatient Medications  Medication Sig Dispense Refill   Prenatal Multivit-Min-Fe-FA (PRENATAL 1 + IRON PO) Take by mouth.     No current facility-administered medications for this visit.    PHYSICAL EXAMINATION:  ECOG PERFORMANCE STATUS: 0 - Asymptomatic   There were no vitals filed for this visit.   There were no vitals filed for this visit.    Physical Exam Vitals and nursing note reviewed.  Constitutional:      Appearance: Normal appearance. She is obese. She is not toxic-appearing or diaphoretic.     Comments: Here with boyfriend  HENT:     Head: Normocephalic and atraumatic.     Right Ear: External ear normal.     Left Ear: External ear normal.     Nose: Nose normal. No congestion or rhinorrhea.  Eyes:     General: No scleral icterus.    Extraocular Movements: Extraocular movements intact.     Conjunctiva/sclera: Conjunctivae normal.     Pupils: Pupils are equal, round, and reactive to light.  Cardiovascular:     Rate and Rhythm: Normal rate.     Heart sounds: No murmur heard.    No friction rub. No gallop.  Pulmonary:     Effort: Pulmonary effort is normal. No respiratory distress.     Breath sounds: Normal breath sounds. No wheezing.  Abdominal:     General: Bowel sounds are normal.     Palpations: Abdomen is soft.     Tenderness: There is no abdominal tenderness. There is no guarding.  Musculoskeletal:        General: No swelling, tenderness or deformity.     Cervical back: Normal range  of motion and neck supple. No rigidity or tenderness.  Lymphadenopathy:     Head:     Right side of head: No submental, submandibular, tonsillar, preauricular, posterior auricular or occipital adenopathy.     Left side of head: No submental, submandibular, tonsillar, preauricular, posterior auricular or occipital adenopathy.     Cervical: No cervical adenopathy.     Right cervical: No superficial, deep or posterior cervical adenopathy.    Left cervical: No superficial, deep or posterior cervical adenopathy.     Upper Body:     Right upper body: No supraclavicular, axillary, pectoral or epitrochlear adenopathy.     Left upper body: No supraclavicular, axillary, pectoral or epitrochlear  adenopathy.  Skin:    General: Skin is warm.     Coloration: Skin is not jaundiced or pale.     Findings: No bruising or lesion.  Neurological:     General: No focal deficit present.     Mental Status: She is alert and oriented to person, place, and time.     Cranial Nerves: No cranial nerve deficit.  Psychiatric:        Mood and Affect: Mood normal.        Behavior: Behavior normal.        Thought Content: Thought content normal.        Judgment: Judgment normal.      LABORATORY DATA: I have personally reviewed the data as listed:  Appointment on 03/01/2022  Component Date Value Ref Range Status   Fibrinogen 03/01/2022 436  210 - 475 mg/dL Final   Comment: (NOTE) Fibrinogen results may be underestimated in patients receiving thrombolytic therapy. Performed at [facility], [place] [address] [place], [place] [address]    Prothrombin Time 03/01/2022 13.2  11.4 - 15.2 seconds Final   INR 03/01/2022 1.0  0.8 - 1.2 Final   Comment: (NOTE) INR goal varies based on device and disease states. Performed at [facility], [place] [address] [place], [place] [address]    aPTT 03/01/2022 31  24 - 36 seconds Final   Performed at [facility], [place]  [address], [place], [place] [address]   Folate 03/01/2022 25.9  >5.9 ng/mL Final   Comment: RESULT CONFIRMED BY MANUAL DILUTION Performed at [facility] [address], [place], [place] [address]    Ferritin 03/01/2022 90  11 - 307 ng/mL Final   Performed at [facility] [address] [place], [place] [address]   Vitamin B-12 03/01/2022 162 (L)  180 - 914 pg/mL Final   Comment: (NOTE) This assay is not validated for testing neonatal or myeloproliferative syndrome specimens for Vitamin B12 levels. Performed at [facility], [place] [address], [place], [place] [address]   Office Visit on 03/01/2022  Component Date Value Ref Range Status   WBC 03/01/2022 6.7  4.0 - 10.5 K/uL Final   RBC 03/01/2022 4.00  3.87 - 5.11 MIL/uL Final   Hemoglobin 03/01/2022 12.3  12.0 - 15.0 g/dL Final   HCT 03/01/2022 36.4  36.0 - 46.0 % Final   MCV 03/01/2022 91.0  80.0 - 100.0 fL Final   MCH 03/01/2022 30.8  26.0 - 34.0 pg Final   MCHC 03/01/2022 33.8  30.0 - 36.0 g/dL Final   RDW 03/01/2022 12.3  11.5 - 15.5 % Final   Platelets 03/01/2022 300  150 - 400 K/uL Final   nRBC 03/01/2022 0.0  0.0 - 0.2 % Final   Neutrophils Relative % 03/01/2022 72  % Final   Neutro Abs 03/01/2022 4.9  1.7 - 7.7 K/uL Final   Lymphocytes Relative 03/01/2022 21  % Final   Lymphs Abs 03/01/2022 1.4  0.7 - 4.0 K/uL Final   Monocytes Relative 03/01/2022 5  % Final   Monocytes Absolute 03/01/2022 0.3  0.1 - 1.0 K/uL Final   Eosinophils Relative 03/01/2022 1  % Final   Eosinophils Absolute 03/01/2022 0.1  0.0 - 0.5 K/uL Final   Basophils Relative 03/01/2022 0  % Final   Basophils Absolute 03/01/2022 0.0  0.0 - 0.1 K/uL Final   Immature Granulocytes 03/01/2022 1  % Final   Abs Immature Granulocytes  03/01/2022 0.04  0.00 - 0.07 K/uL Final   Performed at [facility], [address] [place], [place] [address]   Sodium 03/01/2022 136  135 - 145 mmol/L Final    Potassium 03/01/2022 3.9  3.5 - 5.1 mmol/L Final   Chloride 03/01/2022 105  98 - 111 mmol/L Final   CO2 03/01/2022 22  22 - 32 mmol/L Final   Glucose, Bld 03/01/2022 80  70 - 99 mg/dL Final   Glucose reference range applies only to samples taken after fasting for at least 8 hours.   BUN 03/01/2022 5 (L)  6 - 20 mg/dL Final   Creatinine, Ser 03/01/2022 0.37 (L)  0.44 - 1.00 mg/dL Final   Calcium 66/44/0347 8.8 (L)  8.9 - 10.3 mg/dL Final   Total Protein 42/59/5638 7.3  6.5 - 8.1 g/dL Final   Albumin 75/64/3329 3.6  3.5 - 5.0 g/dL Final   AST 51/88/4166 18  15 - 41 U/L Final   ALT 03/01/2022 15  0 - 44 U/L Final   Alkaline Phosphatase 03/01/2022 51  38 - 126 U/L Final   Total Bilirubin 03/01/2022 0.4  0.3 - 1.2 mg/dL Final   GFR, Estimated 03/01/2022 >60  >60 mL/min Final   Comment: (NOTE) Calculated using the CKD-EPI Creatinine Equation (2021)    Anion gap 03/01/2022 9  5 - 15 Final   Performed at [facility], [address] [place], [place] [address]    RADIOGRAPHIC STUDIES: I have personally reviewed the radiological images as listed and agree with the findings in the report  No results found.  ASSESSMENT/PLAN  27 y.o. female G3 P2-0-0-2 with estimated date of delivery August 31, 2022.  Medical history notable for hypothyroidism, PTSD, scoliosis, cesarean section, polysubstance abuse (in remission).  Patient has history of menorrhagia.  In the course of evaluating patient during this pregnancy Hgb electrophoresis was notable for Hgb A2 of 3.1  Hemoglobin electrophoresis:  Obtained during the course of prenatal evaluation.  Normal.  The Hgb A2 of 3.1 does not constitute an abnormality  Intrauterine pregnancy with history of menorrhagia:   March 01 2022:  Not anemic but is B12 deficient.  Iron stores adequate.  PT/PTT/Fibrinogen normal.    Vitamin B12 deficiency:   Due to high demand state of pregnancy.  To start parenteral B12 replacement on March 09 2022   Cancer Staging  No matching staging information was found for the patient.   No problem-specific Assessment & Plan notes found for this encounter.   No orders of the defined types were placed in this encounter.   22 minutes was spent in patient care.  This included time spent preparing to see the patient (e.g., review of tests), obtaining and/or reviewing separately obtained history, counseling and educating the patient/family/caregiver, ordering medications, tests, or procedures; documenting clinical information in the electronic or other health record, independently interpreting results and communicating results to the patient/family/caregiver as well as coordination of care.      All questions were answered. The patient knows to call the clinic with any problems, questions or concerns.  This note was electronically signed.    Loni Muse, MD  03/08/2022 8:24 AM

## 2022-03-09 ENCOUNTER — Inpatient Hospital Stay: Payer: Medicaid Other

## 2022-03-09 VITALS — BP 113/78 | HR 89 | Temp 98.2°F | Resp 18 | Ht 63.7 in | Wt 210.1 lb

## 2022-03-09 DIAGNOSIS — R778 Other specified abnormalities of plasma proteins: Secondary | ICD-10-CM | POA: Diagnosis not present

## 2022-03-09 DIAGNOSIS — Z3A14 14 weeks gestation of pregnancy: Secondary | ICD-10-CM

## 2022-03-09 MED ORDER — CYANOCOBALAMIN 1000 MCG/ML IJ SOLN
1000.0000 ug | Freq: Once | INTRAMUSCULAR | Status: AC
  Administered 2022-03-09: 1000 ug via INTRAMUSCULAR
  Filled 2022-03-09: qty 1

## 2022-03-09 NOTE — Patient Instructions (Signed)

## 2022-03-10 ENCOUNTER — Inpatient Hospital Stay: Payer: Medicaid Other

## 2022-03-13 ENCOUNTER — Other Ambulatory Visit: Payer: Self-pay | Admitting: Pharmacist

## 2022-03-14 ENCOUNTER — Inpatient Hospital Stay: Payer: Medicaid Other

## 2022-03-14 VITALS — BP 115/79 | HR 81 | Temp 97.5°F | Resp 18 | Wt 214.2 lb

## 2022-03-14 DIAGNOSIS — Z3A14 14 weeks gestation of pregnancy: Secondary | ICD-10-CM

## 2022-03-14 DIAGNOSIS — R778 Other specified abnormalities of plasma proteins: Secondary | ICD-10-CM | POA: Diagnosis not present

## 2022-03-14 MED ORDER — CYANOCOBALAMIN 1000 MCG/ML IJ SOLN
1000.0000 ug | Freq: Once | INTRAMUSCULAR | Status: AC
  Administered 2022-03-14: 1000 ug via INTRAMUSCULAR
  Filled 2022-03-14: qty 1

## 2022-03-14 NOTE — Patient Instructions (Signed)

## 2022-03-15 ENCOUNTER — Inpatient Hospital Stay: Payer: Medicaid Other

## 2022-03-15 VITALS — BP 96/66 | HR 78 | Temp 98.1°F | Resp 18

## 2022-03-15 DIAGNOSIS — Z3A14 14 weeks gestation of pregnancy: Secondary | ICD-10-CM

## 2022-03-15 DIAGNOSIS — R778 Other specified abnormalities of plasma proteins: Secondary | ICD-10-CM | POA: Diagnosis not present

## 2022-03-15 MED ORDER — CYANOCOBALAMIN 1000 MCG/ML IJ SOLN
1000.0000 ug | Freq: Once | INTRAMUSCULAR | Status: AC
  Administered 2022-03-15: 1000 ug via INTRAMUSCULAR
  Filled 2022-03-15: qty 1

## 2022-03-15 NOTE — Patient Instructions (Signed)

## 2022-03-16 ENCOUNTER — Inpatient Hospital Stay: Payer: Medicaid Other

## 2022-03-16 VITALS — BP 114/76 | HR 83 | Temp 97.7°F | Resp 16 | Wt 213.0 lb

## 2022-03-16 DIAGNOSIS — R778 Other specified abnormalities of plasma proteins: Secondary | ICD-10-CM | POA: Diagnosis not present

## 2022-03-16 DIAGNOSIS — Z3A14 14 weeks gestation of pregnancy: Secondary | ICD-10-CM

## 2022-03-16 MED ORDER — CYANOCOBALAMIN 1000 MCG/ML IJ SOLN
1000.0000 ug | Freq: Once | INTRAMUSCULAR | Status: AC
  Administered 2022-03-16: 1000 ug via INTRAMUSCULAR
  Filled 2022-03-16: qty 1

## 2022-03-16 NOTE — Patient Instructions (Signed)

## 2022-03-17 ENCOUNTER — Inpatient Hospital Stay: Payer: Medicaid Other

## 2022-03-17 VITALS — BP 116/77 | HR 87 | Temp 98.2°F | Resp 16 | Ht 63.7 in | Wt 212.1 lb

## 2022-03-17 DIAGNOSIS — R778 Other specified abnormalities of plasma proteins: Secondary | ICD-10-CM | POA: Diagnosis not present

## 2022-03-17 DIAGNOSIS — Z3A14 14 weeks gestation of pregnancy: Secondary | ICD-10-CM

## 2022-03-17 MED ORDER — CYANOCOBALAMIN 1000 MCG/ML IJ SOLN
1000.0000 ug | Freq: Once | INTRAMUSCULAR | Status: AC
  Administered 2022-03-17: 1000 ug via INTRAMUSCULAR
  Filled 2022-03-17: qty 1

## 2022-03-20 ENCOUNTER — Inpatient Hospital Stay: Payer: Medicaid Other

## 2022-03-20 VITALS — BP 121/69 | HR 93 | Temp 98.2°F | Resp 18 | Ht 63.7 in | Wt 213.0 lb

## 2022-03-20 DIAGNOSIS — R778 Other specified abnormalities of plasma proteins: Secondary | ICD-10-CM | POA: Diagnosis not present

## 2022-03-20 DIAGNOSIS — Z3A14 14 weeks gestation of pregnancy: Secondary | ICD-10-CM

## 2022-03-20 MED ORDER — CYANOCOBALAMIN 1000 MCG/ML IJ SOLN
1000.0000 ug | Freq: Once | INTRAMUSCULAR | Status: AC
  Administered 2022-03-20: 1000 ug via INTRAMUSCULAR
  Filled 2022-03-20: qty 1

## 2022-03-20 NOTE — Patient Instructions (Signed)

## 2022-03-28 ENCOUNTER — Inpatient Hospital Stay: Payer: Medicaid Other

## 2022-03-28 VITALS — BP 107/74 | HR 100 | Temp 98.1°F | Resp 16 | Ht 63.7 in | Wt 216.0 lb

## 2022-03-28 DIAGNOSIS — Z3A14 14 weeks gestation of pregnancy: Secondary | ICD-10-CM

## 2022-03-28 DIAGNOSIS — R778 Other specified abnormalities of plasma proteins: Secondary | ICD-10-CM | POA: Diagnosis not present

## 2022-03-28 MED ORDER — CYANOCOBALAMIN 1000 MCG/ML IJ SOLN
1000.0000 ug | Freq: Once | INTRAMUSCULAR | Status: AC
  Administered 2022-03-28: 1000 ug via INTRAMUSCULAR
  Filled 2022-03-28: qty 1

## 2022-03-28 NOTE — Patient Instructions (Signed)

## 2022-04-04 ENCOUNTER — Inpatient Hospital Stay: Payer: Medicaid Other | Attending: Oncology

## 2022-04-04 VITALS — BP 107/63 | HR 83 | Temp 98.0°F | Resp 18 | Ht 63.7 in | Wt 215.1 lb

## 2022-04-04 DIAGNOSIS — E538 Deficiency of other specified B group vitamins: Secondary | ICD-10-CM | POA: Diagnosis present

## 2022-04-04 DIAGNOSIS — Z3A14 14 weeks gestation of pregnancy: Secondary | ICD-10-CM

## 2022-04-04 MED ORDER — CYANOCOBALAMIN 1000 MCG/ML IJ SOLN
1000.0000 ug | Freq: Once | INTRAMUSCULAR | Status: AC
  Administered 2022-04-04: 1000 ug via INTRAMUSCULAR
  Filled 2022-04-04: qty 1

## 2022-04-04 NOTE — Patient Instructions (Signed)

## 2022-04-11 ENCOUNTER — Inpatient Hospital Stay: Payer: Medicaid Other

## 2022-04-11 VITALS — BP 119/75 | HR 92 | Temp 98.0°F | Resp 18 | Ht 63.7 in | Wt 215.1 lb

## 2022-04-11 DIAGNOSIS — Z3A14 14 weeks gestation of pregnancy: Secondary | ICD-10-CM

## 2022-04-11 DIAGNOSIS — E538 Deficiency of other specified B group vitamins: Secondary | ICD-10-CM | POA: Diagnosis not present

## 2022-04-11 MED ORDER — CYANOCOBALAMIN 1000 MCG/ML IJ SOLN
1000.0000 ug | Freq: Once | INTRAMUSCULAR | Status: AC
  Administered 2022-04-11: 1000 ug via INTRAMUSCULAR
  Filled 2022-04-11: qty 1

## 2022-04-11 NOTE — Patient Instructions (Signed)

## 2022-04-12 ENCOUNTER — Inpatient Hospital Stay: Payer: Medicaid Other

## 2022-04-17 ENCOUNTER — Inpatient Hospital Stay: Payer: Medicaid Other

## 2022-04-17 DIAGNOSIS — Z3A14 14 weeks gestation of pregnancy: Secondary | ICD-10-CM

## 2022-04-17 DIAGNOSIS — E538 Deficiency of other specified B group vitamins: Secondary | ICD-10-CM | POA: Diagnosis not present

## 2022-04-17 LAB — CBC WITH DIFFERENTIAL/PLATELET
Abs Immature Granulocytes: 0.07 10*3/uL (ref 0.00–0.07)
Basophils Absolute: 0 10*3/uL (ref 0.0–0.1)
Basophils Relative: 0 %
Eosinophils Absolute: 0.1 10*3/uL (ref 0.0–0.5)
Eosinophils Relative: 1 %
HCT: 34.8 % — ABNORMAL LOW (ref 36.0–46.0)
Hemoglobin: 11.6 g/dL — ABNORMAL LOW (ref 12.0–15.0)
Immature Granulocytes: 1 %
Lymphocytes Relative: 13 %
Lymphs Abs: 1.3 10*3/uL (ref 0.7–4.0)
MCH: 31 pg (ref 26.0–34.0)
MCHC: 33.3 g/dL (ref 30.0–36.0)
MCV: 93 fL (ref 80.0–100.0)
Monocytes Absolute: 0.5 10*3/uL (ref 0.1–1.0)
Monocytes Relative: 5 %
Neutro Abs: 7.8 10*3/uL — ABNORMAL HIGH (ref 1.7–7.7)
Neutrophils Relative %: 80 %
Platelets: 280 10*3/uL (ref 150–400)
RBC: 3.74 MIL/uL — ABNORMAL LOW (ref 3.87–5.11)
RDW: 13.2 % (ref 11.5–15.5)
WBC: 9.8 10*3/uL (ref 4.0–10.5)
nRBC: 0 % (ref 0.0–0.2)

## 2022-04-17 LAB — VITAMIN B12: Vitamin B-12: 299 pg/mL (ref 180–914)

## 2022-04-17 LAB — FERRITIN: Ferritin: 19 ng/mL (ref 11–307)

## 2022-04-17 LAB — FOLATE: Folate: 14.2 ng/mL (ref >5.9)

## 2022-04-18 ENCOUNTER — Inpatient Hospital Stay: Payer: Medicaid Other

## 2022-04-18 VITALS — BP 114/72 | HR 100 | Temp 98.0°F | Resp 20 | Ht 63.7 in | Wt 221.1 lb

## 2022-04-18 DIAGNOSIS — E538 Deficiency of other specified B group vitamins: Secondary | ICD-10-CM | POA: Diagnosis not present

## 2022-04-18 DIAGNOSIS — Z3A14 14 weeks gestation of pregnancy: Secondary | ICD-10-CM

## 2022-04-18 MED ORDER — CYANOCOBALAMIN 1000 MCG/ML IJ SOLN
1000.0000 ug | Freq: Once | INTRAMUSCULAR | Status: AC
  Administered 2022-04-18: 1000 ug via INTRAMUSCULAR
  Filled 2022-04-18: qty 1

## 2022-04-18 NOTE — Patient Instructions (Signed)

## 2022-04-19 ENCOUNTER — Inpatient Hospital Stay (INDEPENDENT_AMBULATORY_CARE_PROVIDER_SITE_OTHER): Payer: Medicaid Other | Admitting: Oncology

## 2022-04-19 VITALS — BP 115/74 | HR 84 | Temp 98.5°F | Resp 16 | Ht 63.7 in | Wt 219.1 lb

## 2022-04-19 DIAGNOSIS — R778 Other specified abnormalities of plasma proteins: Secondary | ICD-10-CM | POA: Diagnosis not present

## 2022-04-19 DIAGNOSIS — R519 Headache, unspecified: Secondary | ICD-10-CM

## 2022-04-19 DIAGNOSIS — E611 Iron deficiency: Secondary | ICD-10-CM

## 2022-04-19 DIAGNOSIS — E538 Deficiency of other specified B group vitamins: Secondary | ICD-10-CM | POA: Diagnosis not present

## 2022-04-19 DIAGNOSIS — Z3A20 20 weeks gestation of pregnancy: Secondary | ICD-10-CM

## 2022-04-19 NOTE — Progress Notes (Unsigned)
[facility] Follow up Visit:  Patient Care Team: Patient, No Pcp Per as PCP - General (General Practice)  CHIEF COMPLAINTS/PURPOSE OF CONSULTATION:  HISTORY OF PRESENTING ILLNESS: [facility] 27 y.o. female is here because of hemoglobin electrophoresis Medical history notable for hypothyroidism, PTSD, scoliosis, cesarean section, polysubstance abuse (in remission)  January 26, 2022 hemoglobin electrophoresis showed hemoglobin A 96.9 hemoglobin A2 3.1  (No abnormal hemoglobins detected) WBC 8.1 hemoglobin 12.9 MCV 90.5 count 303 HIV negative.  RPR negative  March 01, 2022:  [facility] Patient is G3 P2-0-0-2 with estimated date of delivery August 31, 2022 Currently at 14 weeks.  Delivered first pregnancy by NSVD and second pregnancy by C section both without history of hemorrhage.  She needed reoperation to manage an infected wound site from the C section.  No history of blood transfusion.  No history of requiring oral iron and has not required IV iron.  Menses were irregular prior to this pregnancy and often heavy.  No bleeding between periods.  Taking PNV's Not taking ASA or other NSAIDS.  Uses tylenol for HA  Social:  Not working currently.  Smokes 1 to 2 cigarettes daily.  No longer vaping because it makes her nauseated.  EtOH none  [facility] Mother alive 52 thyroid cancer Father alive 103's high cholesterol Brother alive 51 asthma PGM alive 98 easy bruising.    WBC 6.7 hemoglobin 12.3 MCV 91 platelet count 300; 72 segs 31 lymphs 5 monos 1 EO INR 1.0 PTT 31 fibrinogen 436  Ferritin 90 folate 25.9 B12 162 CMP notable for BUN 5 creatinine 0.37 calcium 8.8 albumin 3.6  March 08, 2022:   Patient found to have vitamin B12 deficiency.  Fatigued.  Not sleeping well.  Anorectic in morning but appetite improves by the evening.    March 09 2022:  To start parenteral B12 replacement  April 19 2022:  Scheduled follow-up because of history of  hemoglobin electrophoresis.  At 20 weeks.  Having HA's daily.  Right sided, retro-orbital. Has dark scotoma's and photosensitivity.  Does not have history of migraines.  Feels better after a nap.  Has informed OB about this.  Taking PNV every morning and tolerating them.  Receiving B12 injections.  Fatigued.  Not hypertensive  WBC 9.8 hemoglobin 11.6 MCV 93 platelet count 280. Vitamin B12 299 ferritin 19 folate 14.2  Review of Systems  HENT:   Negative for mouth sores and nosebleeds.        No gingival bleeding  Eyes:  Negative for eye problems and icterus.  Respiratory:  Negative for cough and hemoptysis.        SOB this week because recovering from COVID  Gastrointestinal:  Positive for diarrhea and nausea. Negative for blood in stool and constipation.  Genitourinary:  Negative for dysuria and hematuria.        Urinary frequency with pregnancy    MEDICAL HISTORY: Past Medical History:  Diagnosis Date   Anxiety    Depression    Hypothyroidism    PTSD (post-traumatic stress disorder)    Scoliosis     SURGICAL HISTORY: Past Surgical History:  Procedure Laterality Date   CESAREAN SECTION     INCISION AND DRAINAGE ABSCESS Bilateral 11/07/2015   Procedure: INCISION AND DRAINAGE ABSCESS;  Surgeon: Jonnie Kind, MD;  Location: [facility];  Service: Gynecology;  Laterality: Bilateral;  c/section incision site   TONSILLECTOMY     WOUND EXPLORATION N/A 11/02/2015   Procedure: WOUND EXPLORATION;  Surgeon: Lavonia Drafts, MD;  Location: [facility];  Service: Gynecology;  Laterality: N/A;    SOCIAL HISTORY: Social History   Socioeconomic History   Marital status: Single    Spouse name: Not on file   Number of children: Not on file   Years of education: Not on file   Highest education level: Not on file  Occupational History   Not on file  Tobacco Use   Smoking status: Every Day    Types: Cigarettes   Smokeless tobacco: Never   Tobacco comments:    1 a day  Vaping Use    Vaping Use: Former  Substance and Sexual Activity   Alcohol use: No   Drug use: No   Sexual activity: Never    Birth control/protection: None  Other Topics Concern   Not on file  Social History Narrative   Not on file   Social Determinants of Health   Financial Resource Strain: Not on file  Food Insecurity: Not on file  Transportation Needs: Not on file  Physical Activity: Not on file  Stress: Not on file  Social Connections: Not on file  Intimate Partner Violence: Not on file    FAMILY HISTORY Family History  Problem Relation Age of Onset   Heart disease Mother    Thyroid cancer Mother    Hyperlipidemia Father    Hyperlipidemia Maternal Grandmother    Clotting disorder Paternal Grandfather     ALLERGIES:  is allergic to penicillins, tylenol [acetaminophen], and sulfa antibiotics.  MEDICATIONS:  Current Outpatient Medications  Medication Sig Dispense Refill   folic acid (FOLVITE) 1 MG tablet Take 1 mg by mouth daily.     Prenatal Multivit-Min-Fe-FA (PRENATAL 1 + IRON PO) Take by mouth.     No current facility-administered medications for this visit.    PHYSICAL EXAMINATION:  ECOG PERFORMANCE STATUS: 0 - Asymptomatic   There were no vitals filed for this visit.   There were no vitals filed for this visit.    Physical Exam Vitals and nursing note reviewed.  Constitutional:      Appearance: Normal appearance. She is obese. She is not toxic-appearing or diaphoretic.     Comments: Here with boyfriend  HENT:     Head: Normocephalic and atraumatic.     Right Ear: External ear normal.     Left Ear: External ear normal.     Nose: Nose normal. No congestion or rhinorrhea.  Eyes:     General: No scleral icterus.    Extraocular Movements: Extraocular movements intact.     Conjunctiva/sclera: Conjunctivae normal.     Pupils: Pupils are equal, round, and reactive to light.  Cardiovascular:     Rate and Rhythm: Normal rate.     Heart sounds: No murmur heard.     No friction rub. No gallop.  Pulmonary:     Effort: Pulmonary effort is normal. No respiratory distress.     Breath sounds: Normal breath sounds. No wheezing.  Abdominal:     General: Bowel sounds are normal.     Palpations: Abdomen is soft.     Tenderness: There is no abdominal tenderness. There is no guarding.  Musculoskeletal:        General: No swelling, tenderness or deformity.     Cervical back: Normal range of motion and neck supple. No rigidity or tenderness.  Lymphadenopathy:     Head:     Right side of head: No submental, submandibular, tonsillar, preauricular, posterior auricular or occipital adenopathy.  Left side of head: No submental, submandibular, tonsillar, preauricular, posterior auricular or occipital adenopathy.     Cervical: No cervical adenopathy.     Right cervical: No superficial, deep or posterior cervical adenopathy.    Left cervical: No superficial, deep or posterior cervical adenopathy.     Upper Body:     Right upper body: No supraclavicular, axillary, pectoral or epitrochlear adenopathy.     Left upper body: No supraclavicular, axillary, pectoral or epitrochlear adenopathy.  Skin:    General: Skin is warm.     Coloration: Skin is not jaundiced or pale.     Findings: No bruising or lesion.  Neurological:     General: No focal deficit present.     Mental Status: She is alert and oriented to person, place, and time.     Cranial Nerves: No cranial nerve deficit.  Psychiatric:        Mood and Affect: Mood normal.        Behavior: Behavior normal.        Thought Content: Thought content normal.        Judgment: Judgment normal.      LABORATORY DATA: I have personally reviewed the data as listed:  Appointment on 04/17/2022  Component Date Value Ref Range Status   Ferritin 04/17/2022 19  11 - 307 ng/mL Final   Performed at [facility], [place] [address], [place], [place] [address]   Folate 04/17/2022 14.2  >5.9 ng/mL Final    Performed at [facility], [place] [address], [place], [place] [address]   Vitamin B-12 04/17/2022 299  180 - 914 pg/mL Final   Comment: (NOTE) This assay is not validated for testing neonatal or myeloproliferative syndrome specimens for Vitamin B12 levels. Performed at [facility], [place] [address] [place], [place] [address]    WBC 04/17/2022 9.8  4.0 - 10.5 K/uL Final   RBC 04/17/2022 3.74 (L)  3.87 - 5.11 MIL/uL Final   Hemoglobin 04/17/2022 11.6 (L)  12.0 - 15.0 g/dL Final   HCT 04/17/2022 34.8 (L)  36.0 - 46.0 % Final   MCV 04/17/2022 93.0  80.0 - 100.0 fL Final   MCH 04/17/2022 31.0  26.0 - 34.0 pg Final   MCHC 04/17/2022 33.3  30.0 - 36.0 g/dL Final   RDW 04/17/2022 13.2  11.5 - 15.5 % Final   Platelets 04/17/2022 280  150 - 400 K/uL Final   nRBC 04/17/2022 0.0  0.0 - 0.2 % Final   Neutrophils Relative % 04/17/2022 80  % Final   Neutro Abs 04/17/2022 7.8 (H)  1.7 - 7.7 K/uL Final   Lymphocytes Relative 04/17/2022 13  % Final   Lymphs Abs 04/17/2022 1.3  0.7 - 4.0 K/uL Final   Monocytes Relative 04/17/2022 5  % Final   Monocytes Absolute 04/17/2022 0.5  0.1 - 1.0 K/uL Final   Eosinophils Relative 04/17/2022 1  % Final   Eosinophils Absolute 04/17/2022 0.1  0.0 - 0.5 K/uL Final   Basophils Relative 04/17/2022 0  % Final   Basophils Absolute 04/17/2022 0.0  0.0 - 0.1 K/uL Final   Immature Granulocytes 04/17/2022 1  % Final   Abs Immature Granulocytes 04/17/2022 0.07  0.00 - 0.07 K/uL Final   Performed at [facility], [place] [address], [place], [place] [address]    RADIOGRAPHIC STUDIES: I have personally reviewed the radiological images as listed and agree with the findings in the report  No results found.  ASSESSMENT/PLAN  27 y.o. female G3 P2-0-0-2 with estimated date of delivery August 31, 2022.  Medical history notable for hypothyroidism, PTSD, scoliosis, cesarean section, polysubstance abuse (in remission).  Patient has  history of menorrhagia.  In the course of evaluating patient during this pregnancy Hgb electrophoresis was notable for Hgb A2 of 3.1  Hemoglobin electrophoresis:  Obtained during the course of prenatal evaluation.  Normal.  The Hgb A2 of 3.1 does not constitute an abnormality  Intrauterine pregnancy with history of menorrhagia:   March 01 2022:  Not anemic but is B12 deficient.  Iron stores adequate.  PT/PTT/Fibrinogen normal.    Vitamin B12 deficiency:   Due to high demand state of pregnancy.  To start parenteral B12 replacement on March 09 2022   Cancer Staging  No matching staging information was found for the patient.   No problem-specific Assessment & Plan notes found for this encounter.   No orders of the defined types were placed in this encounter.   22 minutes was spent in patient care.  This included time spent preparing to see the patient (e.g., review of tests), obtaining and/or reviewing separately obtained history, counseling and educating the patient/family/caregiver, ordering medications, tests, or procedures; documenting clinical information in the electronic or other health record, independently interpreting results and communicating results to the patient/family/caregiver as well as coordination of care.      All questions were answered. The patient knows to call the clinic with any problems, questions or concerns.  This note was electronically signed.    Barbee Cough, MD  04/19/2022 12:45 PM

## 2022-04-20 ENCOUNTER — Encounter: Payer: Self-pay | Admitting: Oncology

## 2022-04-20 DIAGNOSIS — E611 Iron deficiency: Secondary | ICD-10-CM | POA: Insufficient documentation

## 2022-04-20 DIAGNOSIS — R519 Headache, unspecified: Secondary | ICD-10-CM | POA: Insufficient documentation

## 2022-05-15 ENCOUNTER — Inpatient Hospital Stay: Payer: Medicaid Other | Attending: Oncology

## 2022-05-15 ENCOUNTER — Encounter: Payer: Self-pay | Admitting: Oncology

## 2022-05-15 DIAGNOSIS — E611 Iron deficiency: Secondary | ICD-10-CM | POA: Diagnosis not present

## 2022-05-15 DIAGNOSIS — Z3A20 20 weeks gestation of pregnancy: Secondary | ICD-10-CM

## 2022-05-15 DIAGNOSIS — E538 Deficiency of other specified B group vitamins: Secondary | ICD-10-CM | POA: Diagnosis not present

## 2022-05-15 DIAGNOSIS — R350 Frequency of micturition: Secondary | ICD-10-CM | POA: Diagnosis not present

## 2022-05-15 LAB — FERRITIN: Ferritin: 11 ng/mL (ref 11–307)

## 2022-05-15 LAB — COMPREHENSIVE METABOLIC PANEL
ALT: 7 U/L (ref 0–44)
AST: 11 U/L — ABNORMAL LOW (ref 15–41)
Albumin: 2.9 g/dL — ABNORMAL LOW (ref 3.5–5.0)
Alkaline Phosphatase: 64 U/L (ref 38–126)
Anion gap: 8 (ref 5–15)
BUN: 5 mg/dL — ABNORMAL LOW (ref 6–20)
CO2: 22 mmol/L (ref 22–32)
Calcium: 8.7 mg/dL — ABNORMAL LOW (ref 8.9–10.3)
Chloride: 106 mmol/L (ref 98–111)
Creatinine, Ser: 0.37 mg/dL — ABNORMAL LOW (ref 0.44–1.00)
GFR, Estimated: >60 mL/min (ref >60)
Glucose, Bld: 79 mg/dL (ref 70–99)
Potassium: 3.6 mmol/L (ref 3.5–5.1)
Sodium: 136 mmol/L (ref 135–145)
Total Bilirubin: 0.2 mg/dL — ABNORMAL LOW (ref 0.3–1.2)
Total Protein: 6.4 g/dL — ABNORMAL LOW (ref 6.5–8.1)

## 2022-05-15 LAB — CBC WITH DIFFERENTIAL/PLATELET
Abs Immature Granulocytes: 0.07 10*3/uL (ref 0.00–0.07)
Basophils Absolute: 0 10*3/uL (ref 0.0–0.1)
Basophils Relative: 0 %
Eosinophils Absolute: 0.1 10*3/uL (ref 0.0–0.5)
Eosinophils Relative: 1 %
HCT: 34 % — ABNORMAL LOW (ref 36.0–46.0)
Hemoglobin: 11.4 g/dL — ABNORMAL LOW (ref 12.0–15.0)
Immature Granulocytes: 1 %
Lymphocytes Relative: 16 %
Lymphs Abs: 1.6 10*3/uL (ref 0.7–4.0)
MCH: 30.8 pg (ref 26.0–34.0)
MCHC: 33.5 g/dL (ref 30.0–36.0)
MCV: 91.9 fL (ref 80.0–100.0)
Monocytes Absolute: 0.4 10*3/uL (ref 0.1–1.0)
Monocytes Relative: 5 %
Neutro Abs: 7.3 10*3/uL (ref 1.7–7.7)
Neutrophils Relative %: 77 %
Platelets: 264 10*3/uL (ref 150–400)
RBC: 3.7 MIL/uL — ABNORMAL LOW (ref 3.87–5.11)
RDW: 13 % (ref 11.5–15.5)
WBC: 9.5 10*3/uL (ref 4.0–10.5)
nRBC: 0 % (ref 0.0–0.2)

## 2022-05-15 LAB — URINALYSIS, DIPSTICK ONLY
Bilirubin Urine: NEGATIVE
Glucose, UA: NEGATIVE mg/dL
Hgb urine dipstick: NEGATIVE
Ketones, ur: NEGATIVE mg/dL
Nitrite: NEGATIVE
Protein, ur: NEGATIVE mg/dL
Specific Gravity, Urine: 1.015 (ref 1.005–1.030)
pH: 6 (ref 5.0–8.0)

## 2022-05-15 LAB — VITAMIN B12: Vitamin B-12: 211 pg/mL (ref 180–914)

## 2022-05-15 LAB — FOLATE: Folate: 10.3 ng/mL (ref >5.9)

## 2022-05-16 ENCOUNTER — Inpatient Hospital Stay: Payer: Medicaid Other

## 2022-05-16 VITALS — BP 124/76 | HR 88 | Temp 97.6°F | Resp 16 | Wt 228.0 lb

## 2022-05-16 DIAGNOSIS — Z3A14 14 weeks gestation of pregnancy: Secondary | ICD-10-CM

## 2022-05-16 DIAGNOSIS — E538 Deficiency of other specified B group vitamins: Secondary | ICD-10-CM | POA: Diagnosis not present

## 2022-05-16 MED ORDER — CYANOCOBALAMIN 1000 MCG/ML IJ SOLN
1000.0000 ug | Freq: Once | INTRAMUSCULAR | Status: AC
  Administered 2022-05-16: 1000 ug via INTRAMUSCULAR
  Filled 2022-05-16: qty 1

## 2022-05-16 NOTE — Patient Instructions (Signed)

## 2022-05-17 ENCOUNTER — Inpatient Hospital Stay (INDEPENDENT_AMBULATORY_CARE_PROVIDER_SITE_OTHER): Payer: Medicaid Other | Admitting: Oncology

## 2022-05-17 VITALS — BP 124/70 | HR 93 | Temp 98.0°F | Resp 18 | Ht 63.7 in | Wt 228.7 lb

## 2022-05-17 DIAGNOSIS — Z3A24 24 weeks gestation of pregnancy: Secondary | ICD-10-CM

## 2022-05-17 DIAGNOSIS — E538 Deficiency of other specified B group vitamins: Secondary | ICD-10-CM | POA: Diagnosis not present

## 2022-05-17 DIAGNOSIS — E611 Iron deficiency: Secondary | ICD-10-CM | POA: Diagnosis not present

## 2022-05-17 DIAGNOSIS — R519 Headache, unspecified: Secondary | ICD-10-CM | POA: Diagnosis not present

## 2022-05-17 NOTE — Progress Notes (Signed)
[facility] Follow up Visit:  Patient Care Team: Patient, No Pcp Per as PCP - General (General Practice)  CHIEF COMPLAINTS/PURPOSE OF CONSULTATION:  HISTORY OF PRESENTING ILLNESS: [facility] 27 y.o. female is here because of hemoglobin electrophoresis Medical history notable for hypothyroidism, PTSD, scoliosis, cesarean section, polysubstance abuse (in remission)  January 26, 2022 hemoglobin electrophoresis showed hemoglobin A 96.9 hemoglobin A2 3.1  (No abnormal hemoglobins detected) WBC 8.1 hemoglobin 12.9 MCV 90.5 count 303 HIV negative.  RPR negative  March 01, 2022:  [facility] Patient is G3 P2-0-0-2 with estimated date of delivery August 31, 2022 Currently at 14 weeks.  Delivered first pregnancy by NSVD and second pregnancy by C section both without history of hemorrhage.  She needed reoperation to manage an infected wound site from the C section.  No history of blood transfusion.  No history of requiring oral iron and has not required IV iron.  Menses were irregular prior to this pregnancy and often heavy.  No bleeding between periods.  Taking PNV's Not taking ASA or other NSAIDS.  Uses tylenol for HA  Social:  Not working currently.  Smokes 1 to 2 cigarettes daily.  No longer vaping because it makes her nauseated.  EtOH none  [facility] Mother alive 16 thyroid cancer Father alive 70's high cholesterol Brother alive 70 asthma PGM alive 98 easy bruising.    WBC 6.7 hemoglobin 12.3 MCV 91 platelet count 300; 72 segs 31 lymphs 5 monos 1 EO INR 1.0 PTT 31 fibrinogen 436  Ferritin 90 folate 25.9 B12 162 CMP notable for BUN 5 creatinine 0.37 calcium 8.8 albumin 3.6  March 08, 2022:   Patient found to have vitamin B12 deficiency.  Fatigued.  Not sleeping well.  Anorectic in morning but appetite improves by the evening.    March 09 2022:  To start parenteral B12 replacement  April 19 2022:   At 20 weeks.  Having HA's daily.  Right  sided, retro-orbital. Has dark scotoma's and photosensitivity.  Does not have history of migraines.  Feels better after a nap.  Has informed OB about this.  Taking PNV every morning and tolerating them.  Receiving B12 injections.  Fatigued.  Not hypertensive  WBC 9.8 hemoglobin 11.6 MCV 93 platelet count 280. Vitamin B12 299 ferritin 19 folate 14.2  May 16 2022:  B12 1000 mcg  May 17 2022:  Scheduled follow-up because of history of hemoglobin electrophoresis and pregnancy.  At 24 weeks.  Taking PNV along with additional folic acid.  Not taking additional iron.  Still having HA's and scotomas.    WBC 9.5 hemoglobin 11.4 platelet count 264 Ferritin 11 folate 10.3 B12 211 CMP notable for BUN of 5 creatinine 0.37 calcium 8.7 albumin 2.9 AST 11 T. bili 0.2 UA negative for protein  Review of Systems  HENT:   Negative for mouth sores and nosebleeds.        No gingival bleeding  Eyes:  Negative for eye problems and icterus.  Respiratory:  Negative for cough and hemoptysis.        SOB improving  Gastrointestinal:  Positive for diarrhea and nausea. Negative for blood in stool and constipation.  Genitourinary:  Negative for dysuria and hematuria.        Urinary frequency with pregnancy    MEDICAL HISTORY: Past Medical History:  Diagnosis Date   Anxiety    Depression    Hypothyroidism    PTSD (post-traumatic stress disorder)    Scoliosis  SURGICAL HISTORY: Past Surgical History:  Procedure Laterality Date   CESAREAN SECTION     INCISION AND DRAINAGE ABSCESS Bilateral 11/07/2015   Procedure: INCISION AND DRAINAGE ABSCESS;  Surgeon: Jonnie Kind, MD;  Location: [facility];  Service: Gynecology;  Laterality: Bilateral;  c/section incision site   TONSILLECTOMY     WOUND EXPLORATION N/A 11/02/2015   Procedure: WOUND EXPLORATION;  Surgeon: Lavonia Drafts, MD;  Location: [facility];  Service: Gynecology;  Laterality: N/A;    SOCIAL HISTORY: Social History   Socioeconomic History    Marital status: Single    Spouse name: Not on file   Number of children: Not on file   Years of education: Not on file   Highest education level: Not on file  Occupational History   Not on file  Tobacco Use   Smoking status: Every Day    Types: Cigarettes   Smokeless tobacco: Never   Tobacco comments:    1 a day  Vaping Use   Vaping Use: Former  Substance and Sexual Activity   Alcohol use: No   Drug use: No   Sexual activity: Never    Birth control/protection: None  Other Topics Concern   Not on file  Social History Narrative   Not on file   Social Determinants of Health   Financial Resource Strain: Not on file  Food Insecurity: Not on file  Transportation Needs: Not on file  Physical Activity: Not on file  Stress: Not on file  Social Connections: Not on file  Intimate Partner Violence: Not on file    FAMILY HISTORY Family History  Problem Relation Age of Onset   Heart disease Mother    Thyroid cancer Mother    Hyperlipidemia Father    Hyperlipidemia Maternal Grandmother    Clotting disorder Paternal Grandfather     ALLERGIES:  is allergic to penicillins, tylenol [acetaminophen], and sulfa antibiotics.  MEDICATIONS:  Current Outpatient Medications  Medication Sig Dispense Refill   folic acid (FOLVITE) 1 MG tablet Take 1 mg by mouth daily.     Prenatal Multivit-Min-Fe-FA (PRENATAL 1 + IRON PO) Take by mouth.     No current facility-administered medications for this visit.    PHYSICAL EXAMINATION:  ECOG PERFORMANCE STATUS: 0 - Asymptomatic   There were no vitals filed for this visit.   There were no vitals filed for this visit.    Physical Exam Vitals and nursing note reviewed.  Constitutional:      Appearance: Normal appearance. She is obese. She is not toxic-appearing or diaphoretic.     Comments: Here with boyfriend  HENT:     Head: Normocephalic and atraumatic.     Right Ear: External ear normal.     Left Ear: External ear normal.      Nose: Nose normal. No congestion or rhinorrhea.  Eyes:     General: No scleral icterus.    Extraocular Movements: Extraocular movements intact.     Conjunctiva/sclera: Conjunctivae normal.     Pupils: Pupils are equal, round, and reactive to light.  Cardiovascular:     Rate and Rhythm: Normal rate.     Heart sounds: No murmur heard.    No friction rub. No gallop.  Pulmonary:     Effort: Pulmonary effort is normal. No respiratory distress.     Breath sounds: Normal breath sounds. No wheezing.  Abdominal:     General: Bowel sounds are normal.     Palpations: Abdomen is soft.     Tenderness:  There is no abdominal tenderness. There is no guarding.  Musculoskeletal:        General: No swelling, tenderness or deformity.     Cervical back: Normal range of motion and neck supple. No rigidity or tenderness.  Lymphadenopathy:     Head:     Right side of head: No submental, submandibular, tonsillar, preauricular, posterior auricular or occipital adenopathy.     Left side of head: No submental, submandibular, tonsillar, preauricular, posterior auricular or occipital adenopathy.     Cervical: No cervical adenopathy.     Right cervical: No superficial, deep or posterior cervical adenopathy.    Left cervical: No superficial, deep or posterior cervical adenopathy.     Upper Body:     Right upper body: No supraclavicular, axillary, pectoral or epitrochlear adenopathy.     Left upper body: No supraclavicular, axillary, pectoral or epitrochlear adenopathy.  Skin:    General: Skin is warm.     Coloration: Skin is not jaundiced or pale.     Findings: No bruising or lesion.  Neurological:     General: No focal deficit present.     Mental Status: She is alert and oriented to person, place, and time.     Cranial Nerves: No cranial nerve deficit.  Psychiatric:        Mood and Affect: Mood normal.        Behavior: Behavior normal.        Thought Content: Thought content normal.        Judgment:  Judgment normal.      LABORATORY DATA: I have personally reviewed the data as listed:  Appointment on 05/15/2022  Component Date Value Ref Range Status   Color, Urine 05/15/2022 YELLOW  YELLOW Final   APPearance 05/15/2022 HAZY (A)  CLEAR Final   Specific Gravity, Urine 05/15/2022 1.015  1.005 - 1.030 Final   pH 05/15/2022 6.0  5.0 - 8.0 Final   Glucose, UA 05/15/2022 NEGATIVE  NEGATIVE mg/dL Final   Hgb urine dipstick 05/15/2022 NEGATIVE  NEGATIVE Final   Bilirubin Urine 05/15/2022 NEGATIVE  NEGATIVE Final   Ketones, ur 05/15/2022 NEGATIVE  NEGATIVE mg/dL Final   Protein, ur 05/15/2022 NEGATIVE  NEGATIVE mg/dL Final   Nitrite 05/15/2022 NEGATIVE  NEGATIVE Final   Leukocytes,Ua 05/15/2022 SMALL (A)  NEGATIVE Final   Performed at [facility], [place] [address] [place], [place] [address]   Sodium 05/15/2022 136  135 - 145 mmol/L Final   Potassium 05/15/2022 3.6  3.5 - 5.1 mmol/L Final   Chloride 05/15/2022 106  98 - 111 mmol/L Final   CO2 05/15/2022 22  22 - 32 mmol/L Final   Glucose, Bld 05/15/2022 79  70 - 99 mg/dL Final   Glucose reference range applies only to samples taken after fasting for at least 8 hours.   BUN 05/15/2022 5 (L)  6 - 20 mg/dL Final   Creatinine, Ser 05/15/2022 0.37 (L)  0.44 - 1.00 mg/dL Final   Calcium 05/15/2022 8.7 (L)  8.9 - 10.3 mg/dL Final   Total Protein 05/15/2022 6.4 (L)  6.5 - 8.1 g/dL Final   Albumin 05/15/2022 2.9 (L)  3.5 - 5.0 g/dL Final   AST 05/15/2022 11 (L)  15 - 41 U/L Final   ALT 05/15/2022 7  0 - 44 U/L Final   Alkaline Phosphatase 05/15/2022 64  38 - 126 U/L Final   Total Bilirubin 05/15/2022 0.2 (L)  0.3 - 1.2 mg/dL Final   GFR, Estimated 05/15/2022 >60  >  60 mL/min Final   Comment: (NOTE) Calculated using the CKD-EPI Creatinine Equation (2021)    Anion gap 05/15/2022 8  5 - 15 Final   Performed at [facility], [place] [address], [place], [place] [address]   Ferritin 05/15/2022 11  11 - 307 ng/mL  Final   Performed at [facility], [place] [address], [place], [place] [address]   Folate 05/15/2022 10.3  >5.9 ng/mL Final   Performed at [facility], [place] [address], [place], [place] [address]   Vitamin B-12 05/15/2022 211  180 - 914 pg/mL Final   Comment: (NOTE) This assay is not validated for testing neonatal or myeloproliferative syndrome specimens for Vitamin B12 levels. Performed at [facility], [place] [address], [place], [place] [address]    WBC 05/15/2022 9.5  4.0 - 10.5 K/uL Final   RBC 05/15/2022 3.70 (L)  3.87 - 5.11 MIL/uL Final   Hemoglobin 05/15/2022 11.4 (L)  12.0 - 15.0 g/dL Final   HCT 05/15/2022 34.0 (L)  36.0 - 46.0 % Final   MCV 05/15/2022 91.9  80.0 - 100.0 fL Final   MCH 05/15/2022 30.8  26.0 - 34.0 pg Final   MCHC 05/15/2022 33.5  30.0 - 36.0 g/dL Final   RDW 05/15/2022 13.0  11.5 - 15.5 % Final   Platelets 05/15/2022 264  150 - 400 K/uL Final   nRBC 05/15/2022 0.0  0.0 - 0.2 % Final   Neutrophils Relative % 05/15/2022 77  % Final   Neutro Abs 05/15/2022 7.3  1.7 - 7.7 K/uL Final   Lymphocytes Relative 05/15/2022 16  % Final   Lymphs Abs 05/15/2022 1.6  0.7 - 4.0 K/uL Final   Monocytes Relative 05/15/2022 5  % Final   Monocytes Absolute 05/15/2022 0.4  0.1 - 1.0 K/uL Final   Eosinophils Relative 05/15/2022 1  % Final   Eosinophils Absolute 05/15/2022 0.1  0.0 - 0.5 K/uL Final   Basophils Relative 05/15/2022 0  % Final   Basophils Absolute 05/15/2022 0.0  0.0 - 0.1 K/uL Final   Immature Granulocytes 05/15/2022 1  % Final   Abs Immature Granulocytes 05/15/2022 0.07  0.00 - 0.07 K/uL Final   Performed at [facility], [place] [address] [place], [place] [address]    RADIOGRAPHIC STUDIES: I have personally reviewed the radiological images as listed and agree with the findings in the report  No results found.  ASSESSMENT/PLAN  26 y.o. female G3 P2-0-0-2 with estimated date of delivery  August 31, 2022.  Medical history notable for hypothyroidism, PTSD, scoliosis, cesarean section, polysubstance abuse (in remission).  Patient has history of menorrhagia.  In the course of evaluating patient during this pregnancy Hgb electrophoresis was notable for Hgb A2 of 3.1  Hemoglobin electrophoresis:  Obtained during the course of prenatal evaluation.  Normal.  The Hgb A2 of 3.1 does not constitute an abnormality  Intrauterine pregnancy with history of menorrhagia:   March 01 2022:  Not anemic but is B12 deficient.  Iron stores adequate.  PT/PTT/Fibrinogen normal.    Vitamin B12 deficiency:   Due to high demand state of pregnancy.   March 09 2022:  Began parenteral B12 replacement  May 17 2022- B12 211.  Continue parenteral B12 replacement  Anemia in pregnancy defined as follows:   ?First trimester - Hemoglobin <11 g/dL (approximately equivalent to a hematocrit <33 percent) ?Second trimester - Hemoglobin <10.5 g/dL (approximate hematocrit <32 percent) ?Third trimester - Hemoglobin <11 g/dL (  approximate hematocrit <33 percent) ?Postpartum - Hemoglobin <10 g/dL (approximate hematocrit <30 percent)  Iron deficiency  April 19 2022:  Ferritin 19;  Hgb 11.6.  To continue PNV.  Will follow Hgb and ferritin levels  May 17 2022- Hgb 11.4 Ferritin 11.  Continue PNV  Headaches April 19 2022:  Likely migraines by description.   May 17 2022- No evidence of HTN or thrombocytopenia to suggest sinister cause.  Can see commonly during pregnancy   Cancer Staging  No matching staging information was found for the patient.   No problem-specific Assessment & Plan notes found for this encounter.   No orders of the defined types were placed in this encounter.   25 minutes was spent in patient care.  This included time spent preparing to see the patient (e.g., review of tests), obtaining and/or reviewing separately obtained history, counseling and educating the patient/family/caregiver,  ordering medications, tests, or procedures; documenting clinical information in the electronic or other health record, independently interpreting results and communicating results to the patient/family/caregiver as well as coordination of care.      All questions were answered. The patient knows to call the clinic with any problems, questions or concerns.  This note was electronically signed.    Barbee Cough, MD  05/17/2022 1:06 PM

## 2022-05-23 ENCOUNTER — Encounter: Payer: Self-pay | Admitting: Oncology

## 2022-06-14 ENCOUNTER — Inpatient Hospital Stay: Payer: Medicaid Other | Attending: Oncology | Admitting: Oncology

## 2022-06-14 ENCOUNTER — Inpatient Hospital Stay: Payer: Medicaid Other

## 2022-06-14 VITALS — BP 128/82 | HR 96 | Temp 98.1°F | Resp 18 | Ht 63.7 in | Wt 235.6 lb

## 2022-06-14 DIAGNOSIS — Z3A29 29 weeks gestation of pregnancy: Secondary | ICD-10-CM

## 2022-06-14 DIAGNOSIS — F17210 Nicotine dependence, cigarettes, uncomplicated: Secondary | ICD-10-CM | POA: Diagnosis not present

## 2022-06-14 DIAGNOSIS — R778 Other specified abnormalities of plasma proteins: Secondary | ICD-10-CM

## 2022-06-14 DIAGNOSIS — E611 Iron deficiency: Secondary | ICD-10-CM | POA: Diagnosis not present

## 2022-06-14 DIAGNOSIS — E538 Deficiency of other specified B group vitamins: Secondary | ICD-10-CM | POA: Diagnosis not present

## 2022-06-14 LAB — CBC WITH DIFFERENTIAL/PLATELET
Abs Immature Granulocytes: 0.14 10*3/uL — ABNORMAL HIGH (ref 0.00–0.07)
Basophils Absolute: 0 10*3/uL (ref 0.0–0.1)
Basophils Relative: 0 %
Eosinophils Absolute: 0.1 10*3/uL (ref 0.0–0.5)
Eosinophils Relative: 1 %
HCT: 36.9 % (ref 36.0–46.0)
Hemoglobin: 12.4 g/dL (ref 12.0–15.0)
Immature Granulocytes: 1 %
Lymphocytes Relative: 15 %
Lymphs Abs: 1.6 10*3/uL (ref 0.7–4.0)
MCH: 30.2 pg (ref 26.0–34.0)
MCHC: 33.6 g/dL (ref 30.0–36.0)
MCV: 90 fL (ref 80.0–100.0)
Monocytes Absolute: 0.6 10*3/uL (ref 0.1–1.0)
Monocytes Relative: 5 %
Neutro Abs: 8.5 10*3/uL — ABNORMAL HIGH (ref 1.7–7.7)
Neutrophils Relative %: 78 %
Platelets: 275 10*3/uL (ref 150–400)
RBC: 4.1 MIL/uL (ref 3.87–5.11)
RDW: 13.3 % (ref 11.5–15.5)
WBC: 10.9 10*3/uL — ABNORMAL HIGH (ref 4.0–10.5)
nRBC: 0 % (ref 0.0–0.2)

## 2022-06-14 LAB — VITAMIN B12: Vitamin B-12: 156 pg/mL — ABNORMAL LOW (ref 180–914)

## 2022-06-14 LAB — FERRITIN: Ferritin: 9 ng/mL — ABNORMAL LOW (ref 11–307)

## 2022-06-14 LAB — FOLATE: Folate: 8.4 ng/mL (ref >5.9)

## 2022-06-14 NOTE — Progress Notes (Signed)
[facility] Cancer Follow up Visit:  Patient Care Team: Patient, No Pcp Per as PCP - General (General Practice)  CHIEF COMPLAINTS/PURPOSE OF CONSULTATION:  HISTORY OF PRESENTING ILLNESS: [facility] 27 y.o. female is here because of hemoglobin electrophoresis Medical history notable for hypothyroidism, PTSD, scoliosis, cesarean section, polysubstance abuse (in remission)  January 26, 2022 hemoglobin electrophoresis showed hemoglobin A 96.9 hemoglobin A2 3.1  (No abnormal hemoglobins detected) WBC 8.1 hemoglobin 12.9 MCV 90.5 count 303 HIV negative.  RPR negative  March 01, 2022:  [facility] Hematology Consult Patient is G3 P2-0-0-2 with estimated date of delivery August 31, 2022 Currently at 14 weeks.  Delivered first pregnancy by NSVD and second pregnancy by C section both without history of hemorrhage.  She needed reoperation to manage an infected wound site from the C section.  No history of blood transfusion.  No history of requiring oral iron and has not required IV iron.  Menses were irregular prior to this pregnancy and often heavy.  No bleeding between periods.  Taking PNV's Not taking ASA or other NSAIDS.  Uses tylenol for HA  Social:  Not working currently.  Smokes 1 to 2 cigarettes daily.  No longer vaping because it makes her nauseated.  EtOH none  [facility] Mother alive 36 thyroid cancer Father alive 52's high cholesterol Brother alive 5 asthma PGM alive 98 easy bruising.    WBC 6.7 hemoglobin 12.3 MCV 91 platelet count 300; 72 segs 31 lymphs 5 monos 1 EO INR 1.0 PTT 31 fibrinogen 436  Ferritin 90 folate 25.9 B12 162 CMP notable for BUN 5 creatinine 0.37 calcium 8.8 albumin 3.6  March 08, 2022:   Patient found to have vitamin B12 deficiency.  Fatigued.  Not sleeping well.  Anorectic in morning but appetite improves by the evening.    March 09 2022:  To start parenteral B12 replacement  April 19 2022:   At 20 weeks.  Having HA's daily.  Right  sided, retro-orbital. Has dark scotoma's and photosensitivity.  Does not have history of migraines.  Feels better after a nap.  Has informed OB about this.  Taking PNV every morning and tolerating them.  Receiving B12 injections.  Fatigued.  Not hypertensive  WBC 9.8 hemoglobin 11.6 MCV 93 platelet count 280. Vitamin B12 299 ferritin 19 folate 14.2  May 16 2022:  B12 1000 mcg  May 17 2022:    At 24 weeks.  Taking PNV along with additional folic acid.  Not taking additional iron.  Still having HA's and scotomas.    WBC 9.5 hemoglobin 11.4 platelet count 264 Ferritin 11 folate 10.3 B12 211 CMP notable for BUN of 5 creatinine 0.37 calcium 8.7 albumin 2.9 AST 11 T. bili 0.2 UA negative for protein  June 14 2022:  Scheduled follow-up because of history of hemoglobin electrophoresis and pregnancy.  Patient at 29 weeks.  Currently on bedrest for preterm labor.  Became dehydrated and required IVF.  Denies nausea, emesis, diarrhea.  States that she is drinking well.  To have a boy, [place].  Taking PNV, B12 injections.  On Flexoril for back pain.    Hgb 12.4 Ferritin 9 B12 156   August 25 2022:  C section.     Review of Systems  HENT:   Negative for mouth sores and nosebleeds.        No gingival bleeding  Eyes:  Negative for eye problems and icterus.  Respiratory:  Negative for cough and hemoptysis.  SOB improving  Gastrointestinal:  Negative for blood in stool, constipation, diarrhea and nausea.  Genitourinary:  Negative for dysuria and hematuria.        Urinary frequency with pregnancy    MEDICAL HISTORY: Past Medical History:  Diagnosis Date   Anxiety    Depression    Hypothyroidism    PTSD (post-traumatic stress disorder)    Scoliosis     SURGICAL HISTORY: Past Surgical History:  Procedure Laterality Date   CESAREAN SECTION     INCISION AND DRAINAGE ABSCESS Bilateral 11/07/2015   Procedure: INCISION AND DRAINAGE ABSCESS;  Surgeon: Tilda Burrow, MD;  Location: [facility]  ORS;  Service: Gynecology;  Laterality: Bilateral;  c/section incision site   TONSILLECTOMY     WOUND EXPLORATION N/A 11/02/2015   Procedure: WOUND EXPLORATION;  Surgeon: Willodean Rosenthal, MD;  Location: [facility] ORS;  Service: Gynecology;  Laterality: N/A;    SOCIAL HISTORY: Social History   Socioeconomic History   Marital status: Single    Spouse name: Not on file   Number of children: Not on file   Years of education: Not on file   Highest education level: Not on file  Occupational History   Not on file  Tobacco Use   Smoking status: Every Day    Types: Cigarettes   Smokeless tobacco: Never   Tobacco comments:    1 a day  Vaping Use   Vaping Use: Former  Substance and Sexual Activity   Alcohol use: No   Drug use: No   Sexual activity: Never    Birth control/protection: None  Other Topics Concern   Not on file  Social History Narrative   Not on file   Social Determinants of Health   Financial Resource Strain: Not on file  Food Insecurity: Not on file  Transportation Needs: Not on file  Physical Activity: Not on file  Stress: Not on file  Social Connections: Not on file  Intimate Partner Violence: Not on file    FAMILY HISTORY Family History  Problem Relation Age of Onset   Heart disease Mother    Thyroid cancer Mother    Hyperlipidemia Father    Hyperlipidemia Maternal Grandmother    Clotting disorder Paternal Grandfather     ALLERGIES:  is allergic to penicillins, tylenol [acetaminophen], and sulfa antibiotics.  MEDICATIONS:  Current Outpatient Medications  Medication Sig Dispense Refill   folic acid (FOLVITE) 1 MG tablet Take 1 mg by mouth daily.     Prenatal Multivit-Min-Fe-FA (PRENATAL 1 + IRON PO) Take by mouth.     No current facility-administered medications for this visit.    PHYSICAL EXAMINATION:  ECOG PERFORMANCE STATUS: 0 - Asymptomatic   Vitals:   06/14/22 1352  BP: 128/82  Pulse: 96  Resp: 18  Temp: 98.1 F (36.7 C)  SpO2:  98%     Filed Weights   06/14/22 1352  Weight: 235 lb 9.6 oz (106.9 kg)      Physical Exam Vitals and nursing note reviewed.  Constitutional:      Appearance: Normal appearance. She is obese. She is not toxic-appearing or diaphoretic.     Comments: Here alone.  Tired appearing  HENT:     Head: Normocephalic and atraumatic.     Right Ear: External ear normal.     Left Ear: External ear normal.     Nose: Nose normal. No congestion or rhinorrhea.  Eyes:     General: No scleral icterus.    Extraocular Movements: Extraocular movements intact.  Conjunctiva/sclera: Conjunctivae normal.     Pupils: Pupils are equal, round, and reactive to light.  Cardiovascular:     Rate and Rhythm: Normal rate.     Heart sounds: No murmur heard.    No friction rub. No gallop.  Pulmonary:     Effort: Pulmonary effort is normal. No respiratory distress.     Breath sounds: Normal breath sounds. No wheezing.  Abdominal:     General: Bowel sounds are normal.     Palpations: Abdomen is soft.     Tenderness: There is no abdominal tenderness. There is no guarding.  Musculoskeletal:        General: No swelling, tenderness or deformity.     Cervical back: Normal range of motion and neck supple. No rigidity or tenderness.  Lymphadenopathy:     Head:     Right side of head: No submental, submandibular, tonsillar, preauricular, posterior auricular or occipital adenopathy.     Left side of head: No submental, submandibular, tonsillar, preauricular, posterior auricular or occipital adenopathy.     Cervical: No cervical adenopathy.     Right cervical: No superficial, deep or posterior cervical adenopathy.    Left cervical: No superficial, deep or posterior cervical adenopathy.     Upper Body:     Right upper body: No supraclavicular, axillary, pectoral or epitrochlear adenopathy.     Left upper body: No supraclavicular, axillary, pectoral or epitrochlear adenopathy.  Skin:    General: Skin is warm.      Coloration: Skin is not jaundiced or pale.     Findings: No bruising or lesion.  Neurological:     General: No focal deficit present.     Mental Status: She is alert and oriented to person, place, and time.     Cranial Nerves: No cranial nerve deficit.  Psychiatric:        Mood and Affect: Mood normal.        Behavior: Behavior normal.        Thought Content: Thought content normal.        Judgment: Judgment normal.      LABORATORY DATA: I have personally reviewed the data as listed:  Appointment on 05/15/2022  Component Date Value Ref Range Status   Color, Urine 05/15/2022 YELLOW  YELLOW Final   APPearance 05/15/2022 HAZY (A)  CLEAR Final   Specific Gravity, Urine 05/15/2022 1.015  1.005 - 1.030 Final   pH 05/15/2022 6.0  5.0 - 8.0 Final   Glucose, UA 05/15/2022 NEGATIVE  NEGATIVE mg/dL Final   Hgb urine dipstick 05/15/2022 NEGATIVE  NEGATIVE Final   Bilirubin Urine 05/15/2022 NEGATIVE  NEGATIVE Final   Ketones, ur 05/15/2022 NEGATIVE  NEGATIVE mg/dL Final   Protein, ur 16/11/9602 NEGATIVE  NEGATIVE mg/dL Final   Nitrite 54/10/8117 NEGATIVE  NEGATIVE Final   Leukocytes,Ua 05/15/2022 SMALL (A)  NEGATIVE Final   Performed at [facility], [address] [place], [place] [address]   Sodium 05/15/2022 136  135 - 145 mmol/L Final   Potassium 05/15/2022 3.6  3.5 - 5.1 mmol/L Final   Chloride 05/15/2022 106  98 - 111 mmol/L Final   CO2 05/15/2022 22  22 - 32 mmol/L Final   Glucose, Bld 05/15/2022 79  70 - 99 mg/dL Final   Glucose reference range applies only to samples taken after fasting for at least 8 hours.   BUN 05/15/2022 5 (L)  6 - 20 mg/dL Final   Creatinine, Ser 05/15/2022 0.37 (L)  0.44 - 1.00  mg/dL Final   Calcium 16/11/9602 8.7 (L)  8.9 - 10.3 mg/dL Final   Total Protein 54/10/8117 6.4 (L)  6.5 - 8.1 g/dL Final   Albumin 14/78/2956 2.9 (L)  3.5 - 5.0 g/dL Final   AST 21/30/8657 11 (L)  15 - 41 U/L Final   ALT 05/15/2022 7  0 - 44 U/L Final    Alkaline Phosphatase 05/15/2022 64  38 - 126 U/L Final   Total Bilirubin 05/15/2022 0.2 (L)  0.3 - 1.2 mg/dL Final   GFR, Estimated 05/15/2022 >60  >60 mL/min Final   Comment: (NOTE) Calculated using the CKD-EPI Creatinine Equation (2021)    Anion gap 05/15/2022 8  5 - 15 Final   Performed at [facility], [address], [place], [place] [address]   Ferritin 05/15/2022 11  11 - 307 ng/mL Final   Performed at [facility], [address], [place], [place] [address]   Folate 05/15/2022 10.3  >5.9 ng/mL Final   Performed at [facility], [address], [place], [place] [address]   Vitamin B-12 05/15/2022 211  180 - 914 pg/mL Final   Comment: (NOTE) This assay is not validated for testing neonatal or myeloproliferative syndrome specimens for Vitamin B12 levels. Performed at [facility], [address], [place], [place] [address]    WBC 05/15/2022 9.5  4.0 - 10.5 K/uL Final   RBC 05/15/2022 3.70 (L)  3.87 - 5.11 MIL/uL Final   Hemoglobin 05/15/2022 11.4 (L)  12.0 - 15.0 g/dL Final   HCT 72/53/6644 34.0 (L)  36.0 - 46.0 % Final   MCV 05/15/2022 91.9  80.0 - 100.0 fL Final   MCH 05/15/2022 30.8  26.0 - 34.0 pg Final   MCHC 05/15/2022 33.5  30.0 - 36.0 g/dL Final   RDW 03/47/4259 13.0  11.5 - 15.5 % Final   Platelets 05/15/2022 264  150 - 400 K/uL Final   nRBC 05/15/2022 0.0  0.0 - 0.2 % Final   Neutrophils Relative % 05/15/2022 77  % Final   Neutro Abs 05/15/2022 7.3  1.7 - 7.7 K/uL Final   Lymphocytes Relative 05/15/2022 16  % Final   Lymphs Abs 05/15/2022 1.6  0.7 - 4.0 K/uL Final   Monocytes Relative 05/15/2022 5  % Final   Monocytes Absolute 05/15/2022 0.4  0.1 - 1.0 K/uL Final   Eosinophils Relative 05/15/2022 1  % Final   Eosinophils Absolute 05/15/2022 0.1  0.0 - 0.5 K/uL Final   Basophils Relative 05/15/2022 0  % Final   Basophils Absolute 05/15/2022 0.0  0.0 - 0.1 K/uL Final   Immature Granulocytes  05/15/2022 1  % Final   Abs Immature Granulocytes 05/15/2022 0.07  0.00 - 0.07 K/uL Final   Performed at [facility], [address], [place], [place] [address]    RADIOGRAPHIC STUDIES: I have personally reviewed the radiological images as listed and agree with the findings in the report  No results found.  ASSESSMENT/PLAN  27 y.o. female G3 P2-0-0-2 with estimated date of delivery August 31, 2022.  Medical history notable for hypothyroidism, PTSD, scoliosis, cesarean section, polysubstance abuse (in remission).  Patient has history of menorrhagia.  In the course of evaluating patient during this pregnancy Hgb electrophoresis was notable for Hgb A2 of 3.1  Hemoglobin electrophoresis:  Obtained during the course of prenatal evaluation.  Normal.  The Hgb A2 of 3.1 does not constitute an abnormality  Intrauterine pregnancy with history of  menorrhagia:   March 01 2022:  Not anemic but is B12 deficient.  Iron stores adequate.  PT/PTT/Fibrinogen normal.    Vitamin B12 deficiency:   Due to high demand state of pregnancy.   March 09 2022:  Began parenteral B12 replacement  May 17 2022- B12 211.  Continue parenteral B12 replacement June 14 2022- B12 156.  Continue parenteral B12 replacement  Anemia in pregnancy defined as follows:   ?Third trimester - Hemoglobin <11 g/dL (approximate hematocrit <33 percent) ?Postpartum - Hemoglobin <10 g/dL (approximate hematocrit <30 percent)  Iron deficiency  April 19 2022:  Ferritin 19;  Hgb 11.6.  To continue PNV.  Will follow Hgb and ferritin levels  May 17 2022- Hgb 11.4 Ferritin 11.  Continue PNV  June 14 2022- Hgb 12.4 Ferritin 8.4.  Continue PNV.    Headaches April 19 2022:  Likely migraines by description.   May 17 2022- No evidence of HTN or thrombocytopenia to suggest sinister cause.  Can see commonly during pregnancy   Cancer Staging  No matching staging information was found for the patient.   No  problem-specific Assessment & Plan notes found for this encounter.   No orders of the defined types were placed in this encounter.   20 minutes was spent in patient care.  This included time spent preparing to see the patient (e.g., review of tests), obtaining and/or reviewing separately obtained history, counseling and educating the patient/family/caregiver, ordering medications, tests, or procedures; documenting clinical information in the electronic or other health record, independently interpreting results and communicating results to the patient/family/caregiver as well as coordination of care.      All questions were answered. The patient knows to call the clinic with any problems, questions or concerns.  This note was electronically signed.    Loni Muse, MD  06/14/2022 2:08 PM

## 2022-06-15 ENCOUNTER — Other Ambulatory Visit: Payer: Self-pay | Admitting: Pharmacist

## 2022-06-16 ENCOUNTER — Inpatient Hospital Stay: Payer: Medicaid Other

## 2022-06-16 VITALS — BP 117/76 | HR 107 | Temp 98.4°F | Resp 16 | Ht 63.7 in | Wt 235.1 lb

## 2022-06-16 DIAGNOSIS — E538 Deficiency of other specified B group vitamins: Secondary | ICD-10-CM | POA: Diagnosis not present

## 2022-06-16 DIAGNOSIS — Z3A14 14 weeks gestation of pregnancy: Secondary | ICD-10-CM

## 2022-06-16 MED ORDER — CYANOCOBALAMIN 1000 MCG/ML IJ SOLN
1000.0000 ug | Freq: Once | INTRAMUSCULAR | Status: AC
  Administered 2022-06-16: 1000 ug via INTRAMUSCULAR
  Filled 2022-06-16: qty 1

## 2022-06-20 ENCOUNTER — Encounter: Payer: Self-pay | Admitting: Oncology

## 2022-07-12 ENCOUNTER — Ambulatory Visit: Payer: Medicaid Other | Admitting: Oncology

## 2022-07-12 ENCOUNTER — Other Ambulatory Visit: Payer: Medicaid Other

## 2022-07-14 ENCOUNTER — Encounter: Payer: Self-pay | Admitting: Oncology

## 2022-07-17 ENCOUNTER — Telehealth: Payer: Self-pay | Admitting: Oncology

## 2022-07-17 ENCOUNTER — Inpatient Hospital Stay: Payer: Medicaid Other

## 2022-07-17 NOTE — Telephone Encounter (Signed)
07/17/2022  Patient is experiencing prelabor symptoms and fiance just had back surgery.  She will call back to reschedule

## 2022-08-17 ENCOUNTER — Inpatient Hospital Stay: Payer: Medicaid Other | Attending: Oncology

## 2022-09-11 ENCOUNTER — Encounter: Payer: Self-pay | Admitting: Oncology

## 2022-09-15 ENCOUNTER — Encounter: Payer: Self-pay | Admitting: Oncology

## 2022-09-18 ENCOUNTER — Ambulatory Visit: Payer: Medicaid Other

## 2022-09-18 ENCOUNTER — Inpatient Hospital Stay: Payer: MEDICAID

## 2022-09-18 ENCOUNTER — Telehealth: Payer: Self-pay | Admitting: Oncology

## 2022-09-18 MED ORDER — CYANOCOBALAMIN 1000 MCG/ML IJ SOLN: 1000.0000 ug | Freq: Once | INTRAMUSCULAR | Status: DC

## 2022-09-18 NOTE — Telephone Encounter (Signed)
Pt came in today for B12 injection appt that was canceled per Allayne Butcher. I clarified with Darl Pikes and pt would need to be seen by MD and have repeat labs prior to receiving any other injections since her last OV was in April and all following appts have either been a cancelation or NS.    I spoke with the pt and notified her, I offered to schedule a follow up appt but pt wishes to just call back to schedule and left without an appt.

## 2023-04-16 ENCOUNTER — Other Ambulatory Visit: Payer: Self-pay | Admitting: Hematology and Oncology

## 2023-04-16 DIAGNOSIS — E538 Deficiency of other specified B group vitamins: Secondary | ICD-10-CM

## 2023-04-17 ENCOUNTER — Inpatient Hospital Stay: Payer: MEDICAID

## 2023-04-17 ENCOUNTER — Encounter: Payer: Self-pay | Admitting: Hematology and Oncology

## 2023-04-17 ENCOUNTER — Inpatient Hospital Stay: Payer: MEDICAID | Attending: Hematology and Oncology | Admitting: Hematology and Oncology

## 2023-04-17 VITALS — BP 128/76 | HR 93 | Temp 98.1°F | Resp 20 | Ht 63.7 in | Wt 245.6 lb

## 2023-04-17 VITALS — BP 129/74 | HR 93 | Temp 98.0°F | Resp 18 | Ht 63.7 in | Wt 247.0 lb

## 2023-04-17 DIAGNOSIS — E611 Iron deficiency: Secondary | ICD-10-CM | POA: Diagnosis not present

## 2023-04-17 DIAGNOSIS — E538 Deficiency of other specified B group vitamins: Secondary | ICD-10-CM

## 2023-04-17 DIAGNOSIS — Z3A15 15 weeks gestation of pregnancy: Secondary | ICD-10-CM | POA: Insufficient documentation

## 2023-04-17 DIAGNOSIS — Z3A14 14 weeks gestation of pregnancy: Secondary | ICD-10-CM

## 2023-04-17 LAB — CBC WITH DIFFERENTIAL (CANCER CENTER ONLY)
Abs Immature Granulocytes: 0.04 10*3/uL (ref 0.00–0.07)
Basophils Absolute: 0 10*3/uL (ref 0.0–0.1)
Basophils Relative: 0 %
Eosinophils Absolute: 0.1 10*3/uL (ref 0.0–0.5)
Eosinophils Relative: 1 %
HCT: 36.4 % (ref 36.0–46.0)
Hemoglobin: 12.4 g/dL (ref 12.0–15.0)
Immature Granulocytes: 1 %
Lymphocytes Relative: 17 %
Lymphs Abs: 1.5 10*3/uL (ref 0.7–4.0)
MCH: 29.2 pg (ref 26.0–34.0)
MCHC: 34.1 g/dL (ref 30.0–36.0)
MCV: 85.8 fL (ref 80.0–100.0)
Monocytes Absolute: 0.4 10*3/uL (ref 0.1–1.0)
Monocytes Relative: 5 %
Neutro Abs: 6.7 10*3/uL (ref 1.7–7.7)
Neutrophils Relative %: 76 %
Platelet Count: 252 10*3/uL (ref 150–400)
RBC: 4.24 MIL/uL (ref 3.87–5.11)
RDW: 14.8 % (ref 11.5–15.5)
WBC Count: 8.7 10*3/uL (ref 4.0–10.5)
nRBC: 0 % (ref 0.0–0.2)
nRBC: 0 /100{WBCs}

## 2023-04-17 LAB — IRON AND TIBC
Iron: 43 ug/dL (ref 28–170)
Saturation Ratios: 10 % — ABNORMAL LOW (ref 10.4–31.8)
TIBC: 455 ug/dL — ABNORMAL HIGH (ref 250–450)
UIBC: 412 ug/dL

## 2023-04-17 LAB — FERRITIN: Ferritin: 11 ng/mL (ref 11–307)

## 2023-04-17 LAB — FOLATE: Folate: 11.5 ng/mL (ref >5.9)

## 2023-04-17 MED ORDER — CYANOCOBALAMIN 1000 MCG/ML IJ SOLN
1000.0000 ug | Freq: Once | INTRAMUSCULAR | Status: AC
  Administered 2023-04-17: 1000 ug via INTRAMUSCULAR
  Filled 2023-04-17: qty 1

## 2023-04-17 NOTE — Progress Notes (Signed)
 [facility]  [address] [place],  [place]  [address] [phone]  Clinic Day:  04/17/2023  Referring physician: Darol Destine*  ASSESSMENT & PLAN:   Assessment & Plan: No problem-specific Assessment & Plan notes found for this encounter.    The patient understands the plans discussed today and is in agreement with them.  She knows to contact our office if she develops concerns prior to her next appointment.   I provided *** minutes of face-to-face time during this encounter and > 50% was spent counseling as documented under my assessment and plan.    Adah Perl, PA-C  [facility] [place] [facility] [address] [place] [address] Dept: [phone] Dept Fax: [phone]   No orders of the defined types were placed in this encounter.     CHIEF COMPLAINT:  CC: ***  Current Treatment:  ***  HISTORY OF PRESENT ILLNESS:   Oncology History   No history exists.      INTERVAL HISTORY:  Raven [name] is here today for repeat clinical assessment. She denies fevers or chills. She denies pain. Her appetite is good. Her weight {Weight change:10426}.  REVIEW OF SYSTEMS:  Review of Systems  Constitutional:  Positive for appetite change (decreased) and fatigue. Negative for chills, diaphoresis, fever and unexpected weight change.  HENT:   Negative for lump/mass, mouth sores, nosebleeds and sore throat.   Respiratory:  Negative for cough, hemoptysis and shortness of breath.   Cardiovascular:  Negative for chest pain and leg swelling.  Gastrointestinal:  Negative for abdominal pain, blood in stool, constipation, diarrhea, nausea and vomiting.  Endocrine: Negative for hot flashes.  Genitourinary:  Positive for menstrual problem (heavy since last delivery in June). Negative for difficulty urinating, dysuria, frequency, hematuria, vaginal bleeding and vaginal discharge.    Musculoskeletal:  Negative for arthralgias, back pain, gait problem and myalgias.  Skin:  Negative for rash.  Neurological:  Negative for dizziness, extremity weakness, gait problem, headaches, light-headedness and numbness.  Hematological:  Negative for adenopathy. Does not bruise/bleed easily.  Psychiatric/Behavioral:  Negative for depression and sleep disturbance. The patient is not nervous/anxious.     VITALS:  Blood pressure 128/76, pulse 93, temperature 98.1 F (36.7 C), temperature source Oral, resp. rate 20, height 5' 3.7" (1.618 m), weight 245 lb 9.6 oz (111.4 kg), last menstrual period 12/20/2022, SpO2 99%, not currently breastfeeding.  Wt Readings from Last 3 Encounters:  04/17/23 245 lb 9.6 oz (111.4 kg)  06/16/22 235 lb 1.3 oz (106.6 kg)  06/14/22 235 lb 9.6 oz (106.9 kg)    Body mass index is 42.56 kg/m.  Performance status (ECOG): {CHL ONC [id number]  PHYSICAL EXAM:  Physical Exam  LABS:      Latest Ref Rng & Units 04/17/2023    9:45 AM 06/14/2022    2:21 PM 05/15/2022    2:43 PM  CBC  WBC 4.0 - 10.5 K/uL 8.7  10.9  9.5   Hemoglobin 12.0 - 15.0 g/dL 65.7  84.6  96.2   Hematocrit 36.0 - 46.0 % 36.4  36.9  34.0   Platelets 150 - 400 K/uL 252  275  264       Latest Ref Rng & Units 05/15/2022    2:43 PM 03/01/2022   11:43 AM 11/07/2015    5:35 AM  CMP  Glucose 70 - 99 mg/dL 79  80    BUN 6 - 20  mg/dL 5  5    Creatinine 4.01 - 1.00 mg/dL 0.27  2.53  6.64   Sodium 135 - 145 mmol/L 136  136    Potassium 3.5 - 5.1 mmol/L 3.6  3.9    Chloride 98 - 111 mmol/L 106  105    CO2 22 - 32 mmol/L 22  22    Calcium 8.9 - 10.3 mg/dL 8.7  8.8    Total Protein 6.5 - 8.1 g/dL 6.4  7.3    Total Bilirubin 0.3 - 1.2 mg/dL 0.2  0.4    Alkaline Phos 38 - 126 U/L 64  51    AST 15 - 41 U/L 11  18    ALT 0 - 44 U/L 7  15       No results found for: "CEA1", "CEA" / No results found for: "CEA1", "CEA" No results found for: "PSA1" No results found for: "QIH474" No results  found for: "CAN125"  No results found for: "TOTALPROTELP", "ALBUMINELP", "A1GS", "A2GS", "BETS", "BETA2SER", "GAMS", "MSPIKE", "SPEI" Lab Results  Component Value Date   FERRITIN 9 (L) 06/14/2022   FERRITIN 11 05/15/2022   FERRITIN 19 04/17/2022   No results found for: "LDH"  STUDIES:  No results found.    HISTORY:   Past Medical History:  Diagnosis Date   Anxiety    Depression    Hypothyroidism    PTSD (post-traumatic stress disorder)    Scoliosis     Past Surgical History:  Procedure Laterality Date   CESAREAN SECTION     INCISION AND DRAINAGE ABSCESS Bilateral 11/07/2015   Procedure: INCISION AND DRAINAGE ABSCESS;  Surgeon: Tilda Burrow, MD;  Location: [facility];  Service: Gynecology;  Laterality: Bilateral;  c/section incision site   TONSILLECTOMY     WOUND EXPLORATION N/A 11/02/2015   Procedure: WOUND EXPLORATION;  Surgeon: Willodean Rosenthal, MD;  Location: [facility]S;  Service: Gynecology;  Laterality: N/A;    Family History  Problem Relation Age of Onset   Heart disease Mother    Thyroid cancer Mother    Hyperlipidemia Father    Hyperlipidemia Maternal Grandmother    Clotting disorder Paternal Grandfather     Social History:  reports that she has quit smoking. Her smoking use included cigarettes. She started smoking about 11 years ago. She has never used smokeless tobacco. She reports that she does not drink alcohol and does not use drugs.The patient is {Blank single:19197::"alone","accompanied by"} *** today.  Allergies:  Allergies  Allergen Reactions   Penicillins Anaphylaxis and Swelling    Has patient had a PCN reaction causing immediate rash, facial/tongue/throat swelling, SOB or lightheadedness with hypotension: Yes Has patient had a PCN reaction causing severe rash involving mucus membranes or skin necrosis: No Has patient had a PCN reaction that required hospitalization Yes Has patient had a PCN reaction occurring within the last 10 years: No If all  of the above answers are "NO", then may proceed with Cephalosporin use.    Terbutaline Swelling   Tylenol [Acetaminophen] Swelling   Sulfa Antibiotics Hives and Rash    Current Medications: Current Outpatient Medications  Medication Sig Dispense Refill   ondansetron (ZOFRAN-ODT) 4 MG disintegrating tablet Take 4 mg by mouth every 6 (six) hours as needed.     VENTOLIN HFA 108 (90 Base) MCG/ACT inhaler Inhale 2 puffs into the lungs every 4 (four) hours as needed.     folic acid (FOLVITE) 1 MG tablet Take 1 mg by mouth daily.  Prenatal Multivit-Min-Fe-FA (PRENATAL 1 + IRON PO) Take by mouth.     No current facility-administered medications for this visit.

## 2023-04-17 NOTE — Patient Instructions (Signed)
 Vitamin B12 Injection What is this medication? Vitamin B12 (VAHY tuh min B12) prevents and treats low vitamin B12 levels in your body. It is used in people who do not get enough vitamin B12 from their diet or when their digestive tract does not absorb enough. Vitamin B12 plays an important role in maintaining the health of your nervous system and red blood cells. This medicine may be used for other purposes; ask your health care provider or pharmacist if you have questions. COMMON BRAND NAME(S): B-12 Compliance Kit, B-12 Injection Kit, Cyomin, Dodex, LA-12, Nutri-Twelve, Physicians EZ Use B-12, Primabalt, Vitamin Deficiency Injectable System - B12 What should I tell my care team before I take this medication? They need to know if you have any of these conditions: Kidney disease Leber's disease Megaloblastic anemia An unusual or allergic reaction to cyanocobalamin, cobalt, other medications, foods, dyes, or preservatives Pregnant or trying to get pregnant Breast-feeding How should I use this medication? This medication is injected into a muscle or deeply under the skin. It is usually given in a clinic or care team's office. However, your care team may teach you how to inject yourself. Follow all instructions. Talk to your care team about the use of this medication in children. Special care may be needed. Overdosage: If you think you have taken too much of this medicine contact a poison control center or emergency room at once. NOTE: This medicine is only for you. Do not share this medicine with others. What if I miss a dose? If you are given your dose at a clinic or care team's office, call to reschedule your appointment. If you give your own injections, and you miss a dose, take it as soon as you can. If it is almost time for your next dose, take only that dose. Do not take double or extra doses. What may interact with this medication? Alcohol Colchicine This list may not describe all possible  interactions. Give your health care provider a list of all the medicines, herbs, non-prescription drugs, or dietary supplements you use. Also tell them if you smoke, drink alcohol, or use illegal drugs. Some items may interact with your medicine. What should I watch for while using this medication? Visit your care team regularly. You may need blood work done while you are taking this medication. You may need to follow a special diet. Talk to your care team. Limit your alcohol intake and avoid smoking to get the best benefit. What side effects may I notice from receiving this medication? Side effects that you should report to your care team as soon as possible: Allergic reactions--skin rash, itching, hives, swelling of the face, lips, tongue, or throat Swelling of the ankles, hands, or feet Trouble breathing Side effects that usually do not require medical attention (report to your care team if they continue or are bothersome): Diarrhea This list may not describe all possible side effects. Call your doctor for medical advice about side effects. You may report side effects to FDA at 1-800-FDA-1088. Where should I keep my medication? Keep out of the reach of children. Store at room temperature between 15 and 30 degrees C (59 and 85 degrees F). Protect from light. Throw away any unused medication after the expiration date. NOTE: This sheet is a summary. It may not cover all possible information. If you have questions about this medicine, talk to your doctor, pharmacist, or health care provider.  2024 Elsevier/Gold Standard (2020-10-26 00:00:00)

## 2023-04-18 ENCOUNTER — Encounter: Payer: Self-pay | Admitting: Hematology and Oncology

## 2023-04-18 NOTE — Assessment & Plan Note (Addendum)
 Recurrent iron deficiency.  She reports resuming regular menses which are heavy after delivery of her child in June.  As she has not anemic, I will have her continue prenatal vitamins and plan to monitor this.  I will plan to see her back in 2 months with a CBC, iron panel and ferritin.

## 2023-04-18 NOTE — Assessment & Plan Note (Addendum)
 Recurrent B12 deficiency.  She is referred back as she is [redacted] weeks pregnant again with an estimated due date of July 15.  She has a history of B12 deficiency diagnosed in January 2024 during pregnancy.  She received a protracted course of B12 injections, followed by monthly B12 until April 2024.  She then failed to keep all appointments.  Due to recurrent B12 deficiency, I recommend resuming B12 injections weekly for 4 weeks, then before indefinitely.  We will give these in our office for the time being.  I will plan to see her back in 2 months with a CBC and B12 level.

## 2023-04-19 ENCOUNTER — Telehealth: Payer: Self-pay

## 2023-04-19 NOTE — Telephone Encounter (Signed)
 Detailed message left for patient.

## 2023-04-19 NOTE — Telephone Encounter (Signed)
-----   Message from Adah Perl sent at 04/18/2023 12:48 PM EST ----- Please let her know her iron is low too. She needs to be sure to continue her prenatal vitamin regularly. I will plan to see her in 2 months instead of 3 months. Scheduling will contact her to change her lab and f/u appt. Thanks

## 2023-04-24 ENCOUNTER — Inpatient Hospital Stay: Payer: MEDICAID

## 2023-04-24 VITALS — BP 113/71 | HR 94 | Temp 98.2°F | Resp 18

## 2023-04-24 DIAGNOSIS — Z3A14 14 weeks gestation of pregnancy: Secondary | ICD-10-CM

## 2023-04-24 DIAGNOSIS — E538 Deficiency of other specified B group vitamins: Secondary | ICD-10-CM | POA: Diagnosis not present

## 2023-04-24 MED ORDER — CYANOCOBALAMIN 1000 MCG/ML IJ SOLN
1000.0000 ug | Freq: Once | INTRAMUSCULAR | Status: AC
  Administered 2023-04-24: 1000 ug via INTRAMUSCULAR
  Filled 2023-04-24: qty 1

## 2023-04-24 NOTE — Patient Instructions (Signed)
 Vitamin B12 Deficiency Vitamin B12 deficiency occurs when the body does not have enough of this important vitamin. The body needs this vitamin: To make red blood cells. To make DNA. This is the genetic material inside cells. To help the nerves work properly so they can carry messages from the brain to the body. Vitamin B12 deficiency can cause health problems, such as not having enough red blood cells in the blood (anemia). This can lead to nerve damage if untreated. What are the causes? This condition may be caused by: Not eating enough foods that contain vitamin B12. Not having enough stomach acid and digestive fluids to properly absorb vitamin B12 from the food that you eat. Having certain diseases that make it hard to absorb vitamin B12. These diseases include Crohn's disease, chronic pancreatitis, and cystic fibrosis. An autoimmune disorder in which the body does not make enough of a protein (intrinsic factor) within the stomach, resulting in not enough absorption of vitamin B12. Having a surgery in which part of the stomach or small intestine is removed. Taking certain medicines that make it hard for the body to absorb vitamin B12. These include: Heartburn medicines, such as antacids and proton pump inhibitors. Some medicines that are used to treat diabetes. What increases the risk? The following factors may make you more likely to develop a vitamin B12 deficiency: Being an older adult. Eating a vegetarian or vegan diet that does not include any foods that come from animals. Eating a poor diet while you are pregnant. Taking certain medicines. Having alcoholism. What are the signs or symptoms? In some cases, there are no symptoms of this condition. If the condition leads to anemia or nerve damage, various symptoms may occur, such as: Weakness. Tiredness (fatigue). Loss of appetite. Numbness or tingling in your hands and feet. Redness and burning of the tongue. Depression,  confusion, or memory problems. Trouble walking. If anemia is severe, symptoms can include: Shortness of breath. Dizziness. Rapid heart rate. How is this diagnosed? This condition may be diagnosed with a blood test to measure the level of vitamin B12 in your blood. You may also have other tests, including: A group of tests that measure certain characteristics of blood cells (complete blood count, CBC). A blood test to measure intrinsic factor. A procedure where a thin tube with a camera on the end is used to look into your stomach or intestines (endoscopy). Other tests may be needed to discover the cause of the deficiency. How is this treated? Treatment for this condition depends on the cause. This condition may be treated by: Changing your eating and drinking habits, such as: Eating more foods that contain vitamin B12. Drinking less alcohol or no alcohol. Getting vitamin B12 injections. Taking vitamin B12 supplements by mouth (orally). Your health care provider will tell you which dose is best for you. Follow these instructions at home: Eating and drinking  Include foods in your diet that come from animals and contain a lot of vitamin B12. These include: Meats and poultry. This includes beef, pork, chicken, [place], and organ meats, such as liver. Seafood. This includes clams, rainbow trout, salmon, tuna, and haddock. Eggs. Dairy foods such as milk, yogurt, and cheese. Eat foods that have vitamin B12 added to them (are fortified), such as ready-to-eat breakfast cereals. Check the label on the package to see if a food is fortified. The items listed above may not be a complete list of foods and beverages you can eat and drink. Contact a dietitian for  more information. Alcohol use Do not drink alcohol if: Your health care provider tells you not to drink. You are pregnant, may be pregnant, or are planning to become pregnant. If you drink alcohol: Limit how much you have to: 0-1 drink a  day for women. 0-2 drinks a day for men. Know how much alcohol is in your drink. In the [place] one drink equals one 12 oz bottle of beer (355 mL), one 5 oz glass of wine (148 mL), or one 1 oz glass of hard liquor (44 mL). General instructions Get vitamin B12 injections if told to by your health care provider. Take supplements only as told by your health care provider. Follow the directions carefully. Keep all follow-up visits. This is important. Contact a health care provider if: Your symptoms come back. Your symptoms get worse or do not improve with treatment. Get help right away: You develop shortness of breath. You have a rapid heart rate. You have chest pain. You become dizzy or you faint. These symptoms may be an emergency. Get help right away. Call 911. Do not wait to see if the symptoms will go away. Do not drive yourself to the hospital. Summary Vitamin B12 deficiency occurs when the body does not have enough of this important vitamin. Common causes include not eating enough foods that contain vitamin B12, not being able to absorb vitamin B12 from the food that you eat, having a surgery in which part of the stomach or small intestine is removed, or taking certain medicines. Eat foods that have vitamin B12 in them. Treatment may include making a change in the way you eat and drink, getting vitamin B12 injections, or taking vitamin B12 supplements. This information is not intended to replace advice given to you by your health care provider. Make sure you discuss any questions you have with your health care provider. Document Revised: 10/08/2020 Document Reviewed: 10/08/2020 Elsevier Patient Education  2024 [employer].

## 2023-05-01 ENCOUNTER — Inpatient Hospital Stay: Payer: MEDICAID | Attending: Hematology and Oncology

## 2023-05-01 VITALS — BP 121/80 | HR 108 | Temp 98.1°F | Resp 18 | Ht 63.7 in | Wt 248.5 lb

## 2023-05-01 DIAGNOSIS — Z3A15 15 weeks gestation of pregnancy: Secondary | ICD-10-CM | POA: Diagnosis not present

## 2023-05-01 DIAGNOSIS — E538 Deficiency of other specified B group vitamins: Secondary | ICD-10-CM | POA: Diagnosis present

## 2023-05-01 DIAGNOSIS — Z3A14 14 weeks gestation of pregnancy: Secondary | ICD-10-CM

## 2023-05-01 MED ORDER — CYANOCOBALAMIN 1000 MCG/ML IJ SOLN
1000.0000 ug | Freq: Once | INTRAMUSCULAR | Status: AC
  Administered 2023-05-01: 1000 ug via INTRAMUSCULAR
  Filled 2023-05-01: qty 1

## 2023-05-01 NOTE — Patient Instructions (Signed)
 Vitamin B12 Injection What is this medication? Vitamin B12 (VAHY tuh min B12) prevents and treats low vitamin B12 levels in your body. It is used in people who do not get enough vitamin B12 from their diet or when their digestive tract does not absorb enough. Vitamin B12 plays an important role in maintaining the health of your nervous system and red blood cells. This medicine may be used for other purposes; ask your health care provider or pharmacist if you have questions. COMMON BRAND NAME(S): B-12 Compliance Kit, B-12 Injection Kit, Cyomin, Dodex, LA-12, Nutri-Twelve, Physicians EZ Use B-12, Primabalt, Vitamin Deficiency Injectable System - B12 What should I tell my care team before I take this medication? They need to know if you have any of these conditions: Kidney disease Leber's disease Megaloblastic anemia An unusual or allergic reaction to cyanocobalamin, cobalt, other medications, foods, dyes, or preservatives Pregnant or trying to get pregnant Breast-feeding How should I use this medication? This medication is injected into a muscle or deeply under the skin. It is usually given in a clinic or care team's office. However, your care team may teach you how to inject yourself. Follow all instructions. Talk to your care team about the use of this medication in children. Special care may be needed. Overdosage: If you think you have taken too much of this medicine contact a poison control center or emergency room at once. NOTE: This medicine is only for you. Do not share this medicine with others. What if I miss a dose? If you are given your dose at a clinic or care team's office, call to reschedule your appointment. If you give your own injections, and you miss a dose, take it as soon as you can. If it is almost time for your next dose, take only that dose. Do not take double or extra doses. What may interact with this medication? Alcohol Colchicine This list may not describe all possible  interactions. Give your health care provider a list of all the medicines, herbs, non-prescription drugs, or dietary supplements you use. Also tell them if you smoke, drink alcohol, or use illegal drugs. Some items may interact with your medicine. What should I watch for while using this medication? Visit your care team regularly. You may need blood work done while you are taking this medication. You may need to follow a special diet. Talk to your care team. Limit your alcohol intake and avoid smoking to get the best benefit. What side effects may I notice from receiving this medication? Side effects that you should report to your care team as soon as possible: Allergic reactions--skin rash, itching, hives, swelling of the face, lips, tongue, or throat Swelling of the ankles, hands, or feet Trouble breathing Side effects that usually do not require medical attention (report to your care team if they continue or are bothersome): Diarrhea This list may not describe all possible side effects. Call your doctor for medical advice about side effects. You may report side effects to FDA at 1-800-FDA-1088. Where should I keep my medication? Keep out of the reach of children. Store at room temperature between 15 and 30 degrees C (59 and 85 degrees F). Protect from light. Throw away any unused medication after the expiration date. NOTE: This sheet is a summary. It may not cover all possible information. If you have questions about this medicine, talk to your doctor, pharmacist, or health care provider.  2024 Elsevier/Gold Standard (2020-10-26 00:00:00)

## 2023-05-08 ENCOUNTER — Inpatient Hospital Stay: Payer: MEDICAID

## 2023-05-08 VITALS — BP 115/76 | HR 92 | Temp 98.0°F | Resp 20 | Ht 63.7 in | Wt 250.0 lb

## 2023-05-08 DIAGNOSIS — E538 Deficiency of other specified B group vitamins: Secondary | ICD-10-CM

## 2023-05-08 DIAGNOSIS — Z3A14 14 weeks gestation of pregnancy: Secondary | ICD-10-CM

## 2023-05-08 MED ORDER — CYANOCOBALAMIN 1000 MCG/ML IJ SOLN
1000.0000 ug | Freq: Once | INTRAMUSCULAR | Status: AC
  Administered 2023-05-08: 1000 ug via INTRAMUSCULAR
  Filled 2023-05-08: qty 1

## 2023-05-08 NOTE — Patient Instructions (Signed)
 Vitamin B12 Injection What is this medication? Vitamin B12 (VAHY tuh min B12) prevents and treats low vitamin B12 levels in your body. It is used in people who do not get enough vitamin B12 from their diet or when their digestive tract does not absorb enough. Vitamin B12 plays an important role in maintaining the health of your nervous system and red blood cells. This medicine may be used for other purposes; ask your health care provider or pharmacist if you have questions. COMMON BRAND NAME(S): B-12 Compliance Kit, B-12 Injection Kit, Cyomin, Dodex, LA-12, Nutri-Twelve, Physicians EZ Use B-12, Primabalt, Vitamin Deficiency Injectable System - B12 What should I tell my care team before I take this medication? They need to know if you have any of these conditions: Kidney disease Leber's disease Megaloblastic anemia An unusual or allergic reaction to cyanocobalamin, cobalt, other medications, foods, dyes, or preservatives Pregnant or trying to get pregnant Breast-feeding How should I use this medication? This medication is injected into a muscle or deeply under the skin. It is usually given in a clinic or care team's office. However, your care team may teach you how to inject yourself. Follow all instructions. Talk to your care team about the use of this medication in children. Special care may be needed. Overdosage: If you think you have taken too much of this medicine contact a poison control center or emergency room at once. NOTE: This medicine is only for you. Do not share this medicine with others. What if I miss a dose? If you are given your dose at a clinic or care team's office, call to reschedule your appointment. If you give your own injections, and you miss a dose, take it as soon as you can. If it is almost time for your next dose, take only that dose. Do not take double or extra doses. What may interact with this medication? Alcohol Colchicine This list may not describe all possible  interactions. Give your health care provider a list of all the medicines, herbs, non-prescription drugs, or dietary supplements you use. Also tell them if you smoke, drink alcohol, or use illegal drugs. Some items may interact with your medicine. What should I watch for while using this medication? Visit your care team regularly. You may need blood work done while you are taking this medication. You may need to follow a special diet. Talk to your care team. Limit your alcohol intake and avoid smoking to get the best benefit. What side effects may I notice from receiving this medication? Side effects that you should report to your care team as soon as possible: Allergic reactions--skin rash, itching, hives, swelling of the face, lips, tongue, or throat Swelling of the ankles, hands, or feet Trouble breathing Side effects that usually do not require medical attention (report to your care team if they continue or are bothersome): Diarrhea This list may not describe all possible side effects. Call your doctor for medical advice about side effects. You may report side effects to FDA at 1-800-FDA-1088. Where should I keep my medication? Keep out of the reach of children. Store at room temperature between 15 and 30 degrees C (59 and 85 degrees F). Protect from light. Throw away any unused medication after the expiration date. NOTE: This sheet is a summary. It may not cover all possible information. If you have questions about this medicine, talk to your doctor, pharmacist, or health care provider.  2024 Elsevier/Gold Standard (2020-10-26 00:00:00)

## 2023-05-15 ENCOUNTER — Inpatient Hospital Stay: Payer: MEDICAID

## 2023-06-08 ENCOUNTER — Other Ambulatory Visit: Payer: Self-pay | Admitting: Hematology and Oncology

## 2023-06-08 DIAGNOSIS — E538 Deficiency of other specified B group vitamins: Secondary | ICD-10-CM

## 2023-06-08 DIAGNOSIS — E611 Iron deficiency: Secondary | ICD-10-CM

## 2023-06-12 ENCOUNTER — Ambulatory Visit: Payer: MEDICAID

## 2023-06-12 ENCOUNTER — Inpatient Hospital Stay (HOSPITAL_BASED_OUTPATIENT_CLINIC_OR_DEPARTMENT_OTHER): Payer: MEDICAID | Admitting: Hematology and Oncology

## 2023-06-12 ENCOUNTER — Telehealth: Payer: Self-pay

## 2023-06-12 ENCOUNTER — Telehealth: Payer: Self-pay | Admitting: Hematology and Oncology

## 2023-06-12 ENCOUNTER — Inpatient Hospital Stay: Payer: MEDICAID

## 2023-06-12 ENCOUNTER — Inpatient Hospital Stay: Payer: MEDICAID | Attending: Hematology and Oncology

## 2023-06-12 ENCOUNTER — Encounter: Payer: Self-pay | Admitting: Hematology and Oncology

## 2023-06-12 VITALS — BP 133/89 | HR 108 | Temp 98.3°F | Resp 20 | Ht 63.7 in | Wt 254.9 lb

## 2023-06-12 DIAGNOSIS — Z3A24 24 weeks gestation of pregnancy: Secondary | ICD-10-CM | POA: Diagnosis not present

## 2023-06-12 DIAGNOSIS — E538 Deficiency of other specified B group vitamins: Secondary | ICD-10-CM

## 2023-06-12 DIAGNOSIS — E611 Iron deficiency: Secondary | ICD-10-CM

## 2023-06-12 DIAGNOSIS — Z3A14 14 weeks gestation of pregnancy: Secondary | ICD-10-CM

## 2023-06-12 LAB — IRON AND TIBC
Iron: 57 ug/dL (ref 28–170)
Saturation Ratios: 10 % — ABNORMAL LOW (ref 10.4–31.8)
TIBC: 564 ug/dL — ABNORMAL HIGH (ref 250–450)
UIBC: 507 ug/dL

## 2023-06-12 LAB — CBC WITH DIFFERENTIAL (CANCER CENTER ONLY)
Abs Immature Granulocytes: 0.1 10*3/uL — ABNORMAL HIGH (ref 0.00–0.07)
Basophils Absolute: 0 10*3/uL (ref 0.0–0.1)
Basophils Relative: 0 %
Eosinophils Absolute: 0.1 10*3/uL (ref 0.0–0.5)
Eosinophils Relative: 1 %
HCT: 34.9 % — ABNORMAL LOW (ref 36.0–46.0)
Hemoglobin: 12 g/dL (ref 12.0–15.0)
Immature Granulocytes: 1 %
Lymphocytes Relative: 14 %
Lymphs Abs: 1.5 10*3/uL (ref 0.7–4.0)
MCH: 29.9 pg (ref 26.0–34.0)
MCHC: 34.4 g/dL (ref 30.0–36.0)
MCV: 86.8 fL (ref 80.0–100.0)
Monocytes Absolute: 0.4 10*3/uL (ref 0.1–1.0)
Monocytes Relative: 4 %
Neutro Abs: 8.6 10*3/uL — ABNORMAL HIGH (ref 1.7–7.7)
Neutrophils Relative %: 80 %
Platelet Count: 262 10*3/uL (ref 150–400)
RBC: 4.02 MIL/uL (ref 3.87–5.11)
RDW: 15.2 % (ref 11.5–15.5)
WBC Count: 10.7 10*3/uL — ABNORMAL HIGH (ref 4.0–10.5)
nRBC: 0 % (ref 0.0–0.2)
nRBC: 0 /100{WBCs}

## 2023-06-12 LAB — FERRITIN: Ferritin: 6 ng/mL — ABNORMAL LOW (ref 11–307)

## 2023-06-12 LAB — VITAMIN B12: Vitamin B-12: 161 pg/mL — ABNORMAL LOW (ref 180–914)

## 2023-06-12 MED ORDER — CYANOCOBALAMIN 1000 MCG/ML IJ SOLN
1000.0000 ug | Freq: Once | INTRAMUSCULAR | Status: AC
  Administered 2023-06-12: 1000 ug via INTRAMUSCULAR
  Filled 2023-06-12: qty 1

## 2023-06-12 NOTE — Progress Notes (Signed)
 [name]  [address] [place],  [place]  [address] [phone]  Clinic Day:  06/12/2023  Referring physician: Angelique Barer, MD  ASSESSMENT & PLAN:   Assessment & Plan: B12 deficiency Recurrent B12 deficiency.  She is referred back at [redacted] weeks pregnant again with an estimated due date of July 15.  She has a history of B12 deficiency diagnosed in January 2024 during pregnancy.  She received a protracted course of B12 injections, followed by monthly B12 until April 2024.  She then failed to keep all appointments.  She had recurrent B12 deficiency, when I saw her 2 months ago, so we resumed B12 injections weekly for 4 weeks.  Unfortunately, she missed her injection last month due to scheduling conflict.  As her vitamin B12 is very low again, I would recommend weekly B12 injections for 4 weeks, then we can go back to injections every 4 weeks.  I will plan to see her back in 2 months with a CBC, iron  studies and B12.  Iron  deficiency Recurrent iron  deficiency.  She reports resuming regular menses which were heavy after delivery of her child in June. She is pregnant again and denies any overt blood loss.  She continues her continue prenatal vitamins without difficulty.  Her hemoglobin remains normal, but she has worsening iron  deficiency.  As she is in her second trimester, I would recommend IV iron  in the form of Venofer  and will arrange for her to start that in the upcoming days.  I will plan to see her back in 2 months with a CBC, iron  panel and ferritin.    The patient understands the plans discussed today and is in agreement with them.  She knows to contact our office if she develops concerns prior to her next appointment.   I provided 15 minutes of face-to-face time during this encounter and > 50% was spent counseling as documented under my assessment and plan.    [name] A [name], PA-C  [facility] [place] [name] [place] [address] [place] [address] Dept: [phone] Dept Fax: [phone]   No orders of the defined types were placed in this encounter.     CHIEF COMPLAINT:  CC: B12 and iron  deficiency in pregnancy  Current Treatment:  B12 injections every 4 weeks, prenatal vitamin  HISTORY OF PRESENT ILLNESS:  Raven [name] is a 28 y.o. female with a history of B12 and iron  deficiency during pregnancy in 2024.  She received a protracted course of B12 injections, followed by monthly B12 until May 2024.  She did not require IV iron  replacement.  She did not come for B12 injections or follow-up after her delivery in June.  She was referred back at in February by Dr. Porfirio Bristol for recurrent B12 deficiency.  She was seen on February 5 for routine prenatal follow-up at [redacted] weeks gestation. She was found to have a B12 level of 150.  She was place on a prenatal vitamin, as well as folic acid  1 mg daily. She had evidence of iron  deficiency, but her hemoglobin was normal and IV iron  cannot be given in the 1st trimester.  INTERVAL HISTORY:  Raven [name] is here today for repeat clinical assessment. She is 24 weeks, 5 days gestation. She reports persistent fatigue. She continues to have occasional tachycardia with associated shortness of breath. She reports chronic headaches unchanged from previous. She denies fevers or chills. She denies  pain. Her appetite is good. Her weight has increased 9 pounds over last 2 months .  She states she missed her B12 injection 4 weeks ago due to another appointment.  She saw her OB this am and her blood pressure was normal. She did have proteinuria. We contacted Dr. Isadore Marble office to let him know her blood pressure was higher this evening.  REVIEW OF SYSTEMS:  Review of Systems  Constitutional:  Positive for fatigue. Negative for appetite change, chills, diaphoresis, fever and unexpected weight change.  HENT:   Negative for lump/mass, mouth sores, nosebleeds  and sore throat.   Respiratory:  Positive for shortness of breath (intermittent with tachycardia). Negative for cough and hemoptysis.   Cardiovascular:  Positive for palpitations (intermittent fast heart rate). Negative for chest pain and leg swelling.  Gastrointestinal:  Negative for abdominal pain, blood in stool, constipation, diarrhea, nausea and vomiting.  Endocrine: Negative for hot flashes.  Genitourinary:  Negative for difficulty urinating, dysuria, frequency, hematuria and vaginal bleeding.   Musculoskeletal:  Negative for arthralgias, back pain, gait problem and myalgias.  Skin:  Negative for rash.  Neurological:  Positive for headaches (chronic, stable). Negative for dizziness, extremity weakness, gait problem, light-headedness and numbness.  Hematological:  Negative for adenopathy. Does not bruise/bleed easily.  Psychiatric/Behavioral:  Negative for depression and sleep disturbance. The patient is not nervous/anxious.      VITALS:  Blood pressure 133/89, pulse (!) 108, temperature 98.3 F (36.8 C), temperature source Oral, resp. rate 20, height 5' 3.7" (1.618 m), weight 254 lb 14.4 oz (115.6 kg), last menstrual period 12/20/2022, SpO2 97%.  Wt Readings from Last 3 Encounters:  06/12/23 254 lb 14.4 oz (115.6 kg)  05/08/23 250 lb 0.6 oz (113.4 kg)  05/01/23 248 lb 8 oz (112.7 kg)    Body mass index is 44.17 kg/m.  Performance status (ECOG): 1 - Symptomatic but completely ambulatory  PHYSICAL EXAM:  Physical Exam Vitals and nursing note reviewed.  Constitutional:      General: She is not in acute distress.    Appearance: Normal appearance.  HENT:     Head: Normocephalic and atraumatic.     Mouth/Throat:     Mouth: Mucous membranes are moist.     Pharynx: Oropharynx is clear. No oropharyngeal exudate or posterior oropharyngeal erythema.  Eyes:     General: No scleral icterus.    Extraocular Movements: Extraocular movements intact.     Conjunctiva/sclera: Conjunctivae  normal.     Pupils: Pupils are equal, round, and reactive to light.  Cardiovascular:     Rate and Rhythm: Normal rate and regular rhythm.     Heart sounds: Normal heart sounds. No murmur heard.    No friction rub. No gallop.  Pulmonary:     Effort: Pulmonary effort is normal.     Breath sounds: Normal breath sounds. No wheezing, rhonchi or rales.  Abdominal:     General: There is no distension.     Palpations: Abdomen is soft. There is no hepatomegaly, splenomegaly or mass.     Tenderness: There is no abdominal tenderness.  Musculoskeletal:        General: Normal range of motion.     Cervical back: Normal range of motion and neck supple. No tenderness.     Right lower leg: No edema.     Left lower leg: No edema.  Lymphadenopathy:     Cervical: No cervical adenopathy.     Upper Body:     Right upper body: No  supraclavicular or axillary adenopathy.     Left upper body: No supraclavicular or axillary adenopathy.     Lower Body: No right inguinal adenopathy. No left inguinal adenopathy.  Skin:    General: Skin is warm and dry.     Coloration: Skin is not jaundiced.     Findings: No rash.  Neurological:     Mental Status: She is alert and oriented to person, place, and time.     Cranial Nerves: No cranial nerve deficit.  Psychiatric:        Mood and Affect: Mood normal.        Behavior: Behavior normal.        Thought Content: Thought content normal.     LABS:      Latest Ref Rng & Units 06/12/2023    1:30 PM 04/17/2023    9:45 AM 06/14/2022    2:21 PM  CBC  WBC 4.0 - 10.5 K/uL 10.7  8.7  10.9   Hemoglobin 12.0 - 15.0 g/dL 41.3  24.4  01.0   Hematocrit 36.0 - 46.0 % 34.9  36.4  36.9   Platelets 150 - 400 K/uL 262  252  275       Latest Ref Rng & Units 05/15/2022    2:43 PM 03/01/2022   11:43 AM 11/07/2015    5:35 AM  CMP  Glucose 70 - 99 mg/dL 79  80    BUN 6 - 20 mg/dL 5  5    Creatinine 2.72 - 1.00 mg/dL 5.36  6.44  0.34   Sodium 135 - 145 mmol/L 136  136     Potassium 3.5 - 5.1 mmol/L 3.6  3.9    Chloride 98 - 111 mmol/L 106  105    CO2 22 - 32 mmol/L 22  22    Calcium 8.9 - 10.3 mg/dL 8.7  8.8    Total Protein 6.5 - 8.1 g/dL 6.4  7.3    Total Bilirubin 0.3 - 1.2 mg/dL 0.2  0.4    Alkaline Phos 38 - 126 U/L 64  51    AST 15 - 41 U/L 11  18    ALT 0 - 44 U/L 7  15      Lab Results  Component Value Date   TIBC 564 (H) 06/12/2023   TIBC 455 (H) 04/17/2023   FERRITIN 6 (L) 06/12/2023   FERRITIN 11 04/17/2023   FERRITIN 9 (L) 06/14/2022   IRONPCTSAT 10 (L) 06/12/2023   IRONPCTSAT 10 (L) 04/17/2023    Latest Reference Range & Units 06/12/23 13:30  Vitamin B12 180 - 914 pg/mL 161 (L)  (L): Data is abnormally low   STUDIES:  No results found.    HISTORY:   Past Medical History:  Diagnosis Date   Anxiety    Depression    Hypothyroidism    PTSD (post-traumatic stress disorder)    Scoliosis     Past Surgical History:  Procedure Laterality Date   CESAREAN SECTION     INCISION AND DRAINAGE ABSCESS Bilateral 11/07/2015   Procedure: INCISION AND DRAINAGE ABSCESS;  Surgeon: Albino Hum, MD;  Location: [facility];  Service: Gynecology;  Laterality: Bilateral;  c/section incision site   TONSILLECTOMY     WOUND EXPLORATION N/A 11/02/2015   Procedure: WOUND EXPLORATION;  Surgeon: Lenord Radon, MD;  Location: [facility];  Service: Gynecology;  Laterality: N/A;    Family History  Problem Relation Age of Onset   Heart disease Mother    Thyroid  cancer Mother    Hyperlipidemia Father    Hyperlipidemia Maternal Grandmother    Clotting disorder Paternal Grandfather     Social History:  reports that she has quit smoking. Her smoking use included cigarettes. She started smoking about 11 years ago. She has never used smokeless tobacco. She reports that she does not drink alcohol and does not use drugs.The patient is alone today.  Allergies:  Allergies  Allergen Reactions   Penicillins Anaphylaxis and Swelling    Has patient had a  PCN reaction causing immediate rash, facial/tongue/throat swelling, SOB or lightheadedness with hypotension: Yes Has patient had a PCN reaction causing severe rash involving mucus membranes or skin necrosis: No Has patient had a PCN reaction that required hospitalization Yes Has patient had a PCN reaction occurring within the last 10 years: No If all of the above answers are "NO", then may proceed with Cephalosporin use.    Terbutaline Swelling   Tylenol  [Acetaminophen ] Swelling   Sulfa Antibiotics Hives and Rash    Current Medications: Current Outpatient Medications  Medication Sig Dispense Refill   cyanocobalamin  (VITAMIN B12) 1000 MCG/ML injection Inject 1,000 mcg into the muscle.     folic acid  (FOLVITE ) 1 MG tablet Take 1 mg by mouth daily.     ondansetron  (ZOFRAN -ODT) 4 MG disintegrating tablet Take 4 mg by mouth every 6 (six) hours as needed.     Prenatal Multivit-Min-Fe-FA (PRENATAL 1 + IRON  PO) Take by mouth.     VENTOLIN HFA 108 (90 Base) MCG/ACT inhaler Inhale 2 puffs into the lungs every 4 (four) hours as needed.     No current facility-administered medications for this visit.

## 2023-06-12 NOTE — Assessment & Plan Note (Signed)
 Recurrent B12 deficiency.  She is referred back at [redacted] weeks pregnant again with an estimated due date of July 15.  She has a history of B12 deficiency diagnosed in January 2024 during pregnancy.  She received a protracted course of B12 injections, followed by monthly B12 until April 2024.  She then failed to keep all appointments.  She had recurrent B12 deficiency, when I saw her 2 months ago, so we resumed B12 injections weekly for 4 weeks.  Unfortunately, she missed her injection last month due to scheduling conflict.  As her vitamin B12 is very low again, I would recommend weekly B12 injections for 4 weeks, then we can go back to injections every 4 weeks.  I will plan to see her back in 2 months with a CBC, iron studies and B12.

## 2023-06-12 NOTE — Assessment & Plan Note (Signed)
 Recurrent iron deficiency.  She reports resuming regular menses which were heavy after delivery of her child in June. She is pregnant again and denies any overt blood loss.  She continues her continue prenatal vitamins without difficulty.  Her hemoglobin remains normal. Iron studies are pending from today.  I will plan to see her back in 2 months with a CBC, iron panel and ferritin.

## 2023-06-12 NOTE — Telephone Encounter (Signed)
 Patient has been scheduled for follow-up visit per 06/12/23 LOS.  Pt given an appt calendar with date and time.

## 2023-06-12 NOTE — Telephone Encounter (Signed)
 Spoke with Melony Squibb RN at [facility] in [place]. Made her aware Alejandra Amos could see the protein in the urine that they checked at there office this A.M. and patients BP was elevated here today 133/89. Patient also has elevated heart rate of 108 and c/o of increased heartrate and shortness of breath at times , o2 sat good now. Pam voiced she would notify her provider.

## 2023-06-12 NOTE — Patient Instructions (Signed)
 Vitamin B12 Injection What is this medication? Vitamin B12 (VAHY tuh min B12) prevents and treats low vitamin B12 levels in your body. It is used in people who do not get enough vitamin B12 from their diet or when their digestive tract does not absorb enough. Vitamin B12 plays an important role in maintaining the health of your nervous system and red blood cells. This medicine may be used for other purposes; ask your health care provider or pharmacist if you have questions. COMMON BRAND NAME(S): B-12 Compliance Kit, B-12 Injection Kit, Cyomin, Dodex, LA-12, Nutri-Twelve, Physicians EZ Use B-12, Primabalt, Vitamin Deficiency Injectable System - B12 What should I tell my care team before I take this medication? They need to know if you have any of these conditions: Kidney disease Leber's disease Megaloblastic anemia An unusual or allergic reaction to cyanocobalamin, cobalt, other medications, foods, dyes, or preservatives Pregnant or trying to get pregnant Breast-feeding How should I use this medication? This medication is injected into a muscle or deeply under the skin. It is usually given in a clinic or care team's office. However, your care team may teach you how to inject yourself. Follow all instructions. Talk to your care team about the use of this medication in children. Special care may be needed. Overdosage: If you think you have taken too much of this medicine contact a poison control center or emergency room at once. NOTE: This medicine is only for you. Do not share this medicine with others. What if I miss a dose? If you are given your dose at a clinic or care team's office, call to reschedule your appointment. If you give your own injections, and you miss a dose, take it as soon as you can. If it is almost time for your next dose, take only that dose. Do not take double or extra doses. What may interact with this medication? Alcohol Colchicine This list may not describe all possible  interactions. Give your health care provider a list of all the medicines, herbs, non-prescription drugs, or dietary supplements you use. Also tell them if you smoke, drink alcohol, or use illegal drugs. Some items may interact with your medicine. What should I watch for while using this medication? Visit your care team regularly. You may need blood work done while you are taking this medication. You may need to follow a special diet. Talk to your care team. Limit your alcohol intake and avoid smoking to get the best benefit. What side effects may I notice from receiving this medication? Side effects that you should report to your care team as soon as possible: Allergic reactions--skin rash, itching, hives, swelling of the face, lips, tongue, or throat Swelling of the ankles, hands, or feet Trouble breathing Side effects that usually do not require medical attention (report to your care team if they continue or are bothersome): Diarrhea This list may not describe all possible side effects. Call your doctor for medical advice about side effects. You may report side effects to FDA at 1-800-FDA-1088. Where should I keep my medication? Keep out of the reach of children. Store at room temperature between 15 and 30 degrees C (59 and 85 degrees F). Protect from light. Throw away any unused medication after the expiration date. NOTE: This sheet is a summary. It may not cover all possible information. If you have questions about this medicine, talk to your doctor, pharmacist, or health care provider.  2024 Elsevier/Gold Standard (2020-10-26 00:00:00)

## 2023-06-13 ENCOUNTER — Encounter: Payer: Self-pay | Admitting: Hematology and Oncology

## 2023-06-13 ENCOUNTER — Telehealth: Payer: Self-pay

## 2023-06-13 NOTE — Telephone Encounter (Signed)
-----   Message from Alfonso Ike sent at 06/13/2023 10:20 AM EDT ----- Please let her know her iron stores are lower and as she is in her 2nd trimester we can give IV iron, that I discussed with her yesterday. Is she ok with that? Thanks

## 2023-06-13 NOTE — Telephone Encounter (Signed)
 Detailed message left for patient  to call us  back , concerning starting IV iron.

## 2023-06-13 NOTE — Telephone Encounter (Signed)
----   Message from Alfonso Ike sent at 06/13/2023 10:20 AM EDT ----- Please let her know her iron stores are lower and as she is in her 2nd trimester we can give IV iron, that I discussed with her yesterday. Is she ok with that? Thanks

## 2023-06-18 ENCOUNTER — Inpatient Hospital Stay: Payer: MEDICAID

## 2023-06-18 VITALS — BP 110/73 | HR 84 | Temp 98.1°F | Resp 18

## 2023-06-18 DIAGNOSIS — Z3A14 14 weeks gestation of pregnancy: Secondary | ICD-10-CM

## 2023-06-18 DIAGNOSIS — E538 Deficiency of other specified B group vitamins: Secondary | ICD-10-CM | POA: Diagnosis not present

## 2023-06-18 MED ORDER — IRON SUCROSE 20 MG/ML IV SOLN
200.0000 mg | Freq: Once | INTRAVENOUS | Status: AC
  Administered 2023-06-18: 200 mg via INTRAVENOUS
  Filled 2023-06-18: qty 10

## 2023-06-18 MED ORDER — CYANOCOBALAMIN 1000 MCG/ML IJ SOLN
1000.0000 ug | Freq: Once | INTRAMUSCULAR | Status: AC
  Administered 2023-06-18: 1000 ug via INTRAMUSCULAR
  Filled 2023-06-18: qty 1

## 2023-06-18 NOTE — Patient Instructions (Signed)
 Vitamin B12 Injection What is this medication? Vitamin B12 (VAHY tuh min B12) prevents and treats low vitamin B12 levels in your body. It is used in people who do not get enough vitamin B12 from their diet or when their digestive tract does not absorb enough. Vitamin B12 plays an important role in maintaining the health of your nervous system and red blood cells. This medicine may be used for other purposes; ask your health care provider or pharmacist if you have questions. COMMON BRAND NAME(S): B-12 Compliance Kit, B-12 Injection Kit, Cyomin, Dodex, LA-12, Nutri-Twelve, Physicians EZ Use B-12, Primabalt, Vitamin Deficiency Injectable System - B12 What should I tell my care team before I take this medication? They need to know if you have any of these conditions: Kidney disease Leber's disease Megaloblastic anemia An unusual or allergic reaction to cyanocobalamin, cobalt, other medications, foods, dyes, or preservatives Pregnant or trying to get pregnant Breast-feeding How should I use this medication? This medication is injected into a muscle or deeply under the skin. It is usually given in a clinic or care team's office. However, your care team may teach you how to inject yourself. Follow all instructions. Talk to your care team about the use of this medication in children. Special care may be needed. Overdosage: If you think you have taken too much of this medicine contact a poison control center or emergency room at once. NOTE: This medicine is only for you. Do not share this medicine with others. What if I miss a dose? If you are given your dose at a clinic or care team's office, call to reschedule your appointment. If you give your own injections, and you miss a dose, take it as soon as you can. If it is almost time for your next dose, take only that dose. Do not take double or extra doses. What may interact with this medication? Alcohol Colchicine This list may not describe all possible  interactions. Give your health care provider a list of all the medicines, herbs, non-prescription drugs, or dietary supplements you use. Also tell them if you smoke, drink alcohol, or use illegal drugs. Some items may interact with your medicine. What should I watch for while using this medication? Visit your care team regularly. You may need blood work done while you are taking this medication. You may need to follow a special diet. Talk to your care team. Limit your alcohol intake and avoid smoking to get the best benefit. What side effects may I notice from receiving this medication? Side effects that you should report to your care team as soon as possible: Allergic reactions--skin rash, itching, hives, swelling of the face, lips, tongue, or throat Swelling of the ankles, hands, or feet Trouble breathing Side effects that usually do not require medical attention (report to your care team if they continue or are bothersome): Diarrhea This list may not describe all possible side effects. Call your doctor for medical advice about side effects. You may report side effects to FDA at 1-800-FDA-1088. Where should I keep my medication? Keep out of the reach of children. Store at room temperature between 15 and 30 degrees C (59 and 85 degrees F). Protect from light. Throw away any unused medication after the expiration date. NOTE: This sheet is a summary. It may not cover all possible information. If you have questions about this medicine, talk to your doctor, pharmacist, or health care provider.  2024 Elsevier/Gold Standard (2020-10-26 00:00:00)Iron Sucrose Injection What is this medication? IRON SUCROSE (  EYE ern SOO krose) treats low levels of iron (iron deficiency anemia) in people with kidney disease. Iron is a mineral that plays an important role in making red blood cells, which carry oxygen from your lungs to the rest of your body. This medicine may be used for other purposes; ask your health  care provider or pharmacist if you have questions. COMMON BRAND NAME(S): Venofer What should I tell my care team before I take this medication? They need to know if you have any of these conditions: Anemia not caused by low iron levels Heart disease High levels of iron in the blood Kidney disease Liver disease An unusual or allergic reaction to iron, other medications, foods, dyes, or preservatives Pregnant or trying to get pregnant Breastfeeding How should I use this medication? This medication is infused into a vein. It is given by your care team in a hospital or clinic setting. Talk to your care team about the use of this medication in children. While it may be prescribed for children as young as 2 years for selected conditions, precautions do apply. Overdosage: If you think you have taken too much of this medicine contact a poison control center or emergency room at once. NOTE: This medicine is only for you. Do not share this medicine with others. What if I miss a dose? Keep appointments for follow-up doses. It is important not to miss your dose. Call your care team if you are unable to keep an appointment. What may interact with this medication? Do not take this medication with any of the following: Deferoxamine Dimercaprol Other iron products This medication may also interact with the following: Chloramphenicol Deferasirox This list may not describe all possible interactions. Give your health care provider a list of all the medicines, herbs, non-prescription drugs, or dietary supplements you use. Also tell them if you smoke, drink alcohol, or use illegal drugs. Some items may interact with your medicine. What should I watch for while using this medication? Visit your care team for regular checks on your progress. Tell your care team if your symptoms do not start to get better or if they get worse. You may need blood work done while you are taking this medication. You may need to  eat more foods that contain iron. Talk to your care team. Foods that contain iron include whole grains or cereals, dried fruits, beans, peas, leafy green vegetables, and organ meats (liver, kidney). What side effects may I notice from receiving this medication? Side effects that you should report to your care team as soon as possible: Allergic reactions--skin rash, itching, hives, swelling of the face, lips, tongue, or throat Low blood pressure--dizziness, feeling faint or lightheaded, blurry vision Shortness of breath Side effects that usually do not require medical attention (report to your care team if they continue or are bothersome): Flushing Headache Joint pain Muscle pain Nausea Pain, redness, or irritation at injection site This list may not describe all possible side effects. Call your doctor for medical advice about side effects. You may report side effects to FDA at 1-800-FDA-1088. Where should I keep my medication? This medication is given in a hospital or clinic. It will not be stored at home. NOTE: This sheet is a summary. It may not cover all possible information. If you have questions about this medicine, talk to your doctor, pharmacist, or health care provider.  2024 Elsevier/Gold Standard (2022-10-04 00:00:00)

## 2023-06-20 ENCOUNTER — Other Ambulatory Visit: Payer: Self-pay | Admitting: Pharmacist

## 2023-06-20 ENCOUNTER — Inpatient Hospital Stay: Payer: MEDICAID

## 2023-06-20 VITALS — BP 118/74 | HR 90 | Temp 98.0°F | Resp 18

## 2023-06-20 DIAGNOSIS — E538 Deficiency of other specified B group vitamins: Secondary | ICD-10-CM | POA: Diagnosis not present

## 2023-06-20 DIAGNOSIS — Z3A14 14 weeks gestation of pregnancy: Secondary | ICD-10-CM

## 2023-06-20 MED ORDER — IRON SUCROSE 20 MG/ML IV SOLN
200.0000 mg | Freq: Once | INTRAVENOUS | Status: AC
  Administered 2023-06-20: 200 mg via INTRAVENOUS
  Filled 2023-06-20: qty 10

## 2023-06-20 MED ORDER — SODIUM CHLORIDE 0.9 % IV SOLN: INTRAVENOUS | Status: DC

## 2023-06-20 MED ORDER — SODIUM CHLORIDE 0.9% FLUSH
10.0000 mL | Freq: Once | INTRAVENOUS | Status: AC | PRN
  Administered 2023-06-20: 10 mL

## 2023-06-20 MED ORDER — CYANOCOBALAMIN 1000 MCG/ML IJ SOLN
1000.0000 ug | Freq: Once | INTRAMUSCULAR | Status: AC
  Administered 2023-06-20: 1000 ug via INTRAMUSCULAR
  Filled 2023-06-20: qty 1

## 2023-06-20 NOTE — Progress Notes (Signed)
 Patient reports that for 2 days after last iron  she had extreme fatigue, shortness of breath, sweating and dizziness- Amalia Badder Phy aware. Will proceed with second iron  today. Patient states understanding to call for any worsening or continuing of symptoms.

## 2023-06-20 NOTE — Patient Instructions (Signed)
 Vitamin B12 Injection What is this medication? Vitamin B12 (VAHY tuh min B12) prevents and treats low vitamin B12 levels in your body. It is used in people who do not get enough vitamin B12 from their diet or when their digestive tract does not absorb enough. Vitamin B12 plays an important role in maintaining the health of your nervous system and red blood cells. This medicine may be used for other purposes; ask your health care provider or pharmacist if you have questions. COMMON BRAND NAME(S): B-12 Compliance Kit, B-12 Injection Kit, Cyomin, Dodex, LA-12, Nutri-Twelve, Physicians EZ Use B-12, Primabalt, Vitamin Deficiency Injectable System - B12 What should I tell my care team before I take this medication? They need to know if you have any of these conditions: Kidney disease Leber's disease Megaloblastic anemia An unusual or allergic reaction to cyanocobalamin, cobalt, other medications, foods, dyes, or preservatives Pregnant or trying to get pregnant Breast-feeding How should I use this medication? This medication is injected into a muscle or deeply under the skin. It is usually given in a clinic or care team's office. However, your care team may teach you how to inject yourself. Follow all instructions. Talk to your care team about the use of this medication in children. Special care may be needed. Overdosage: If you think you have taken too much of this medicine contact a poison control center or emergency room at once. NOTE: This medicine is only for you. Do not share this medicine with others. What if I miss a dose? If you are given your dose at a clinic or care team's office, call to reschedule your appointment. If you give your own injections, and you miss a dose, take it as soon as you can. If it is almost time for your next dose, take only that dose. Do not take double or extra doses. What may interact with this medication? Alcohol Colchicine This list may not describe all possible  interactions. Give your health care provider a list of all the medicines, herbs, non-prescription drugs, or dietary supplements you use. Also tell them if you smoke, drink alcohol, or use illegal drugs. Some items may interact with your medicine. What should I watch for while using this medication? Visit your care team regularly. You may need blood work done while you are taking this medication. You may need to follow a special diet. Talk to your care team. Limit your alcohol intake and avoid smoking to get the best benefit. What side effects may I notice from receiving this medication? Side effects that you should report to your care team as soon as possible: Allergic reactions--skin rash, itching, hives, swelling of the face, lips, tongue, or throat Swelling of the ankles, hands, or feet Trouble breathing Side effects that usually do not require medical attention (report to your care team if they continue or are bothersome): Diarrhea This list may not describe all possible side effects. Call your doctor for medical advice about side effects. You may report side effects to FDA at 1-800-FDA-1088. Where should I keep my medication? Keep out of the reach of children. Store at room temperature between 15 and 30 degrees C (59 and 85 degrees F). Protect from light. Throw away any unused medication after the expiration date. NOTE: This sheet is a summary. It may not cover all possible information. If you have questions about this medicine, talk to your doctor, pharmacist, or health care provider.  2024 Elsevier/Gold Standard (2020-10-26 00:00:00)Iron Sucrose Injection What is this medication? IRON SUCROSE (  EYE ern SOO krose) treats low levels of iron (iron deficiency anemia) in people with kidney disease. Iron is a mineral that plays an important role in making red blood cells, which carry oxygen from your lungs to the rest of your body. This medicine may be used for other purposes; ask your health  care provider or pharmacist if you have questions. COMMON BRAND NAME(S): Venofer What should I tell my care team before I take this medication? They need to know if you have any of these conditions: Anemia not caused by low iron levels Heart disease High levels of iron in the blood Kidney disease Liver disease An unusual or allergic reaction to iron, other medications, foods, dyes, or preservatives Pregnant or trying to get pregnant Breastfeeding How should I use this medication? This medication is infused into a vein. It is given by your care team in a hospital or clinic setting. Talk to your care team about the use of this medication in children. While it may be prescribed for children as young as 2 years for selected conditions, precautions do apply. Overdosage: If you think you have taken too much of this medicine contact a poison control center or emergency room at once. NOTE: This medicine is only for you. Do not share this medicine with others. What if I miss a dose? Keep appointments for follow-up doses. It is important not to miss your dose. Call your care team if you are unable to keep an appointment. What may interact with this medication? Do not take this medication with any of the following: Deferoxamine Dimercaprol Other iron products This medication may also interact with the following: Chloramphenicol Deferasirox This list may not describe all possible interactions. Give your health care provider a list of all the medicines, herbs, non-prescription drugs, or dietary supplements you use. Also tell them if you smoke, drink alcohol, or use illegal drugs. Some items may interact with your medicine. What should I watch for while using this medication? Visit your care team for regular checks on your progress. Tell your care team if your symptoms do not start to get better or if they get worse. You may need blood work done while you are taking this medication. You may need to  eat more foods that contain iron. Talk to your care team. Foods that contain iron include whole grains or cereals, dried fruits, beans, peas, leafy green vegetables, and organ meats (liver, kidney). What side effects may I notice from receiving this medication? Side effects that you should report to your care team as soon as possible: Allergic reactions--skin rash, itching, hives, swelling of the face, lips, tongue, or throat Low blood pressure--dizziness, feeling faint or lightheaded, blurry vision Shortness of breath Side effects that usually do not require medical attention (report to your care team if they continue or are bothersome): Flushing Headache Joint pain Muscle pain Nausea Pain, redness, or irritation at injection site This list may not describe all possible side effects. Call your doctor for medical advice about side effects. You may report side effects to FDA at 1-800-FDA-1088. Where should I keep my medication? This medication is given in a hospital or clinic. It will not be stored at home. NOTE: This sheet is a summary. It may not cover all possible information. If you have questions about this medicine, talk to your doctor, pharmacist, or health care provider.  2024 Elsevier/Gold Standard (2022-10-04 00:00:00)

## 2023-06-21 ENCOUNTER — Telehealth: Payer: Self-pay

## 2023-06-21 NOTE — Telephone Encounter (Signed)
 Called patient to inquire about how she tolerated 2nd venofer  administration- Patient states that she did feel "tired" but that she did not have increased heart rate, dizziness nausea or extreme fatigue.

## 2023-06-22 ENCOUNTER — Inpatient Hospital Stay: Payer: MEDICAID

## 2023-06-22 VITALS — BP 123/76 | HR 96 | Temp 98.0°F | Resp 18

## 2023-06-22 DIAGNOSIS — E538 Deficiency of other specified B group vitamins: Secondary | ICD-10-CM | POA: Diagnosis not present

## 2023-06-22 DIAGNOSIS — Z3A14 14 weeks gestation of pregnancy: Secondary | ICD-10-CM

## 2023-06-22 MED ORDER — CYANOCOBALAMIN 1000 MCG/ML IJ SOLN
1000.0000 ug | Freq: Once | INTRAMUSCULAR | Status: AC
  Administered 2023-06-22: 1000 ug via INTRAMUSCULAR
  Filled 2023-06-22: qty 1

## 2023-06-22 MED ORDER — SODIUM CHLORIDE 0.9% FLUSH
10.0000 mL | Freq: Once | INTRAVENOUS | Status: AC | PRN
  Administered 2023-06-22: 10 mL

## 2023-06-22 MED ORDER — IRON SUCROSE 20 MG/ML IV SOLN
200.0000 mg | Freq: Once | INTRAVENOUS | Status: AC
  Administered 2023-06-22: 200 mg via INTRAVENOUS
  Filled 2023-06-22: qty 10

## 2023-06-22 NOTE — Patient Instructions (Signed)

## 2023-06-26 ENCOUNTER — Inpatient Hospital Stay: Payer: MEDICAID

## 2023-06-26 VITALS — BP 121/70 | HR 102 | Temp 98.0°F | Resp 18

## 2023-06-26 DIAGNOSIS — Z3A14 14 weeks gestation of pregnancy: Secondary | ICD-10-CM

## 2023-06-26 DIAGNOSIS — E538 Deficiency of other specified B group vitamins: Secondary | ICD-10-CM | POA: Diagnosis not present

## 2023-06-26 MED ORDER — SODIUM CHLORIDE 0.9% FLUSH
10.0000 mL | Freq: Once | INTRAVENOUS | Status: AC | PRN
  Administered 2023-06-26: 10 mL

## 2023-06-26 MED ORDER — IRON SUCROSE 20 MG/ML IV SOLN
200.0000 mg | Freq: Once | INTRAVENOUS | Status: AC
Start: 2023-06-26 — End: 2023-06-26
  Administered 2023-06-26: 200 mg via INTRAVENOUS
  Filled 2023-06-26: qty 10

## 2023-06-26 MED ORDER — SODIUM CHLORIDE 0.9 % IV SOLN: INTRAVENOUS | Status: DC

## 2023-06-26 MED ORDER — CYANOCOBALAMIN 1000 MCG/ML IJ SOLN
1000.0000 ug | Freq: Once | INTRAMUSCULAR | Status: AC
Start: 2023-06-26 — End: 2023-06-26
  Administered 2023-06-26: 1000 ug via INTRAMUSCULAR
  Filled 2023-06-26: qty 1

## 2023-06-26 NOTE — Patient Instructions (Signed)

## 2023-06-28 ENCOUNTER — Inpatient Hospital Stay: Payer: MEDICAID

## 2023-07-03 ENCOUNTER — Inpatient Hospital Stay: Payer: MEDICAID | Attending: Hematology and Oncology

## 2023-07-03 VITALS — BP 112/77 | HR 84 | Temp 98.2°F | Resp 18

## 2023-07-03 DIAGNOSIS — E538 Deficiency of other specified B group vitamins: Secondary | ICD-10-CM | POA: Diagnosis present

## 2023-07-03 DIAGNOSIS — Z3A14 14 weeks gestation of pregnancy: Secondary | ICD-10-CM

## 2023-07-03 DIAGNOSIS — E611 Iron deficiency: Secondary | ICD-10-CM | POA: Diagnosis not present

## 2023-07-03 DIAGNOSIS — Z3A24 24 weeks gestation of pregnancy: Secondary | ICD-10-CM | POA: Insufficient documentation

## 2023-07-03 MED ORDER — IRON SUCROSE 20 MG/ML IV SOLN
200.0000 mg | Freq: Once | INTRAVENOUS | Status: AC
  Administered 2023-07-03: 200 mg via INTRAVENOUS
  Filled 2023-07-03: qty 10

## 2023-07-03 MED ORDER — CYANOCOBALAMIN 1000 MCG/ML IJ SOLN
1000.0000 ug | Freq: Once | INTRAMUSCULAR | Status: AC
Start: 2023-07-03 — End: 2023-07-03
  Administered 2023-07-03: 1000 ug via INTRAMUSCULAR
  Filled 2023-07-03: qty 1

## 2023-07-03 NOTE — Progress Notes (Signed)
 Pt declined 30 min wait time, d/c stable

## 2023-07-10 ENCOUNTER — Inpatient Hospital Stay: Payer: MEDICAID

## 2023-07-17 ENCOUNTER — Ambulatory Visit: Payer: MEDICAID | Admitting: Hematology and Oncology

## 2023-07-17 ENCOUNTER — Other Ambulatory Visit: Payer: MEDICAID

## 2023-07-24 NOTE — Progress Notes (Signed)
 Subjective:  Raven [name] is a 28 y.o. [phone] at [redacted]w[redacted]d presenting for prenatal visit. She is feeling well, no recent illness. Rupture of membranes: None, Uterine contractions: very intense since 0630 this morning, and Vaginal bleeding: None   EDD: 09/27/2023, by Last Menstrual Period  Objective:  BP 120/74   Wt 117 kg (258 lb 12.8 oz)   LMP 12/21/2022 (Exact Date)   BMI 47.34 kg/m  See flowsheet  Assessment/Plan:  Raven [name] is a 28 y.o. [id number] at [redacted]w[redacted]d presenting for prenatal visit.  FHT: 146  FH: 32  Was in hospital on Saturday with contractions got IV fluids and was sent home, 1cm.  This morning contractions are back and very intense.  Grimacing during appt and palpate moderate to strong.  2.5cm, to L&D, on call doctor notified.  Raven [name] was seen today for routine prenatal visit.  Diagnoses and all orders for this visit:  Prenatal care, subsequent pregnancy in third trimester (CMD) -     POC Urinalysis Auto without Microscopic  [redacted] weeks gestation of pregnancy (CMD)  Obesity during pregnancy in third trimester (CMD) -     US  OB Follow Up Transabdominal Approach; Future  Preterm uterine contractions in third trimester, antepartum (CMD)

## 2023-07-31 NOTE — Progress Notes (Signed)
 Patient in 1 weeks s/p c-section with Dr.Bellamy, wound vac removed, incision healing well, mild redness noted to incision, patient c/o severe tenderness to (L) side of incision, cleaned with peroxide, temp 99.3, patient reports temp of 102 yesterday. Dr.Ward in to evaluate incision, she order Keflex 500mg  tid x 7 days, keep clean and dry and f/I in 1 week for nurse visit. Rx erx'd to pharmacy. Patient verbalized understanding.

## 2023-08-01 ENCOUNTER — Other Ambulatory Visit: Payer: Self-pay | Admitting: Hematology and Oncology

## 2023-08-01 DIAGNOSIS — E538 Deficiency of other specified B group vitamins: Secondary | ICD-10-CM

## 2023-08-01 DIAGNOSIS — E611 Iron deficiency: Secondary | ICD-10-CM

## 2023-08-01 NOTE — Progress Notes (Deleted)
 [facility]  [address] [place],  [place]  [address] [phone]  Clinic Day:  08/02/2023  Referring physician: Jama Chow, MD  ASSESSMENT & PLAN:   Assessment & Plan: Iron  deficiency Recurrent iron  deficiency during pregnancy.  She reports resuming regular menses, which were heavy after delivery of her child in June.  She received IV iron  in the form of Venofer  in April and May.   B12 deficiency Recurrent B12 deficiency.  She has a history of B12 deficiency diagnosed in January 2024 during pregnancy.  She received a protracted course of B12 injections, followed by monthly B12 until April 2024.  She then failed to keep all appointments.   She was referred back in February at [redacted] weeks pregnant again with an estimated due date of July 15.  She had recurrent B12 deficiency, so we resumed B12 injections weekly for 4 weeks.  At her visit in April, her vitamin B12 was very low again, so I recommended weekly B12 injections for 4 weeks again, followed by B12 injections every 4 weeks.      The patient understands the plans discussed today and is in agreement with them.  She knows to contact our office if she develops concerns prior to her next appointment.   I provided *** minutes of face-to-face time during this encounter and > 50% was spent counseling as documented under my assessment and plan.    Raven Hamada A Tsugio Elison, PA-C  [facility]  - A DEPT OF [facility] [address] [place] [address] Dept: [phone] Dept Fax: [phone]   No orders of the defined types were placed in this encounter.     CHIEF COMPLAINT:  CC: B12 and iron  deficiency in pregnancy  Current Treatment:  B12 injections monthly, IV iron  as needed  HISTORY OF PRESENT ILLNESS:  Raven [name] is a 28 y.o. female with a history of B12 and iron  deficiency during pregnancy in 2024.  She received a protracted course of B12 injections,  followed by monthly B12 until May 2024.  She did not require IV iron  replacement.  She did not come for B12 injections or follow-up after her delivery in June.  She was referred back at in February by Dr. Zane Lager for recurrent B12 deficiency.  She was seen on February 5 for routine prenatal follow-up at [redacted] weeks gestation. She was found to have a B12 level of 150.  She was place on a prenatal vitamin, as well as folic acid  1 mg daily. She had evidence of iron  deficiency, but her hemoglobin was normal and IV iron  cannot be given in the 1st trimester. When she reached her 2nd trimester in April, she was given IV in the form of Venofer .  INTERVAL HISTORY:  Raven [name] is here today for repeat clinical assessment. She denies fevers or chills. She denies pain. Her appetite is good. Her weight {Weight change:10426}.  REVIEW OF SYSTEMS:   Review of Systems - Oncology   VITALS:   Last menstrual period 12/20/2022.  Wt Readings from Last 3 Encounters:  06/12/23 254 lb 14.4 oz (115.6 kg)  05/08/23 250 lb 0.6 oz (113.4 kg)  05/01/23 248 lb 8 oz (112.7 kg)    There is no height or weight on file to calculate BMI.  Performance status (ECOG): {CHL ONC [id number]    PHYSICAL EXAM:   Physical Exam  LABS:      Latest Ref Rng & Units 06/12/2023  1:30 PM 04/17/2023    9:45 AM 06/14/2022    2:21 PM  CBC  WBC 4.0 - 10.5 K/uL 10.7  8.7  10.9   Hemoglobin 12.0 - 15.0 g/dL 87.9  87.5  87.5   Hematocrit 36.0 - 46.0 % 34.9  36.4  36.9   Platelets 150 - 400 K/uL 262  252  275       Latest Ref Rng & Units 05/15/2022    2:43 PM 03/01/2022   11:43 AM 11/07/2015    5:35 AM  CMP  Glucose 70 - 99 mg/dL 79  80    BUN 6 - 20 mg/dL 5  5    Creatinine 9.55 - 1.00 mg/dL 9.62  9.62  9.37   Sodium 135 - 145 mmol/L 136  136    Potassium 3.5 - 5.1 mmol/L 3.6  3.9    Chloride 98 - 111 mmol/L 106  105    CO2 22 - 32 mmol/L 22  22    Calcium 8.9 - 10.3 mg/dL 8.7  8.8    Total Protein 6.5 - 8.1 g/dL 6.4   7.3    Total Bilirubin 0.3 - 1.2 mg/dL 0.2  0.4    Alkaline Phos 38 - 126 U/L 64  51    AST 15 - 41 U/L 11  18    ALT 0 - 44 U/L 7  15       No results found for: CEA1, CEA / No results found for: CEA1, CEA No results found for: PSA1 No results found for: CAN199 No results found for: CAN125  No results found for: TOTALPROTELP, ALBUMINELP, A1GS, A2GS, BETS, BETA2SER, GAMS, MSPIKE, SPEI Lab Results  Component Value Date   TIBC 564 (H) 06/12/2023   TIBC 455 (H) 04/17/2023   FERRITIN 6 (L) 06/12/2023   FERRITIN 11 04/17/2023   FERRITIN 9 (L) 06/14/2022   IRONPCTSAT 10 (L) 06/12/2023   IRONPCTSAT 10 (L) 04/17/2023   No results found for: LDH  STUDIES:   No results found.    HISTORY:   Past Medical History:  Diagnosis Date   Anxiety    Depression    Hypothyroidism    PTSD (post-traumatic stress disorder)    Scoliosis     Past Surgical History:  Procedure Laterality Date   CESAREAN SECTION     INCISION AND DRAINAGE ABSCESS Bilateral 11/07/2015   Procedure: INCISION AND DRAINAGE ABSCESS;  Surgeon: Norleen LULLA Server, MD;  Location: [facility];  Service: Gynecology;  Laterality: Bilateral;  c/section incision site   TONSILLECTOMY     WOUND EXPLORATION N/A 11/02/2015   Procedure: WOUND EXPLORATION;  Surgeon: Elveria Mungo, MD;  Location: [facility];  Service: Gynecology;  Laterality: N/A;    Family History  Problem Relation Age of Onset   Heart disease Mother    Thyroid cancer Mother    Hyperlipidemia Father    Hyperlipidemia Maternal Grandmother    Clotting disorder Paternal Grandfather     Social History:  reports that she has quit smoking. Her smoking use included cigarettes. She started smoking about 11 years ago. She has never used smokeless tobacco. She reports that she does not drink alcohol and does not use drugs.The patient is {Blank single:19197::alone,accompanied by} *** today.  Allergies:  Allergies  Allergen Reactions    Penicillins Anaphylaxis and Swelling    Has patient had a PCN reaction causing immediate rash, facial/tongue/throat swelling, SOB or lightheadedness with hypotension: Yes Has patient had a PCN reaction causing severe rash involving  mucus membranes or skin necrosis: No Has patient had a PCN reaction that required hospitalization Yes Has patient had a PCN reaction occurring within the last 10 years: No If all of the above answers are NO, then may proceed with Cephalosporin use.    Terbutaline Swelling   Tylenol  [Acetaminophen ] Swelling    PT STATES SHE IS NOT ALLERGIC TO TYLENOL    Sulfa Antibiotics Hives and Rash    Current Medications: Current Outpatient Medications  Medication Sig Dispense Refill   busPIRone (BUSPAR) 10 MG tablet Take by mouth.     cyanocobalamin  (VITAMIN B12) 1000 MCG/ML injection Inject 1,000 mcg into the muscle.     folic acid  (FOLVITE ) 1 MG tablet Take 1 mg by mouth daily.     ondansetron  (ZOFRAN -ODT) 4 MG disintegrating tablet Take 4 mg by mouth every 6 (six) hours as needed.     Prenatal Multivit-Min-Fe-FA (PRENATAL 1 + IRON  PO) Take by mouth.     VENTOLIN HFA 108 (90 Base) MCG/ACT inhaler Inhale 2 puffs into the lungs every 4 (four) hours as needed.     No current facility-administered medications for this visit.

## 2023-08-01 NOTE — Assessment & Plan Note (Deleted)
 Recurrent B12 deficiency.  She has a history of B12 deficiency diagnosed in January 2024 during pregnancy.  She received a protracted course of B12 injections, followed by monthly B12 until April 2024.  She then failed to keep all appointments.   She was referred back in February at [redacted] weeks pregnant again with an estimated due date of July 15.  She had recurrent B12 deficiency, so we resumed B12 injections weekly for 4 weeks.  At her visit in April, her vitamin B12 was very low again, so I recommended weekly B12 injections for 4 weeks again, followed by B12 injections every 4 weeks.

## 2023-08-01 NOTE — Assessment & Plan Note (Deleted)
 Recurrent iron  deficiency during pregnancy.  She reports resuming regular menses, which were heavy after delivery of her child in June.  She received IV iron  in the form of Venofer  in April and May.

## 2023-08-07 ENCOUNTER — Inpatient Hospital Stay: Payer: MEDICAID | Admitting: Hematology and Oncology

## 2023-08-07 ENCOUNTER — Inpatient Hospital Stay: Payer: MEDICAID

## 2023-08-07 DIAGNOSIS — E611 Iron deficiency: Secondary | ICD-10-CM

## 2023-08-07 DIAGNOSIS — E538 Deficiency of other specified B group vitamins: Secondary | ICD-10-CM

## 2023-08-07 NOTE — Progress Notes (Signed)
 Pt is here after having a C-section 07/24/23 with Dr Daune, she is doing well, baby is still in the NICU @ [facility], he was born 10wks early. Incision looks good, there is no adhesive on the incision. Wound care is discussed and she already has an appointment to come back in 4 weeks for her postpartum. Pt voices an understanding to call if any problems

## 2023-08-23 NOTE — Progress Notes (Signed)
 Patient in for wound check, c/o an area that has drained. A small area to (L) side of incision noted to be a little red, a suture is visible, very tender to patient. Dr.Bellamy in to evaluate, suture clipped, area cleaned with peroxide and steri strip applied per Dr.Bellamy. Patient to call for problems.

## 2023-09-04 ENCOUNTER — Inpatient Hospital Stay: Payer: MEDICAID

## 2023-12-09 ENCOUNTER — Encounter (HOSPITAL_BASED_OUTPATIENT_CLINIC_OR_DEPARTMENT_OTHER): Payer: Self-pay

## 2023-12-09 ENCOUNTER — Ambulatory Visit (HOSPITAL_BASED_OUTPATIENT_CLINIC_OR_DEPARTMENT_OTHER)
Admission: EM | Admit: 2023-12-09 | Discharge: 2023-12-09 | Disposition: A | Discharge status: Home / Self Care | Payer: MEDICAID | Attending: Family Medicine | Admitting: Family Medicine

## 2023-12-09 DIAGNOSIS — R051 Acute cough: Secondary | ICD-10-CM

## 2023-12-09 LAB — POC SOFIA SARS ANTIGEN FIA: SARS Coronavirus 2 Ag: NEGATIVE

## 2023-12-09 NOTE — Discharge Instructions (Signed)
 Covid test negative Most likely something viral.  Recommend over-the-counter medications for symptoms as needed.  You can take Tylenol , ibuprofen , Mucinex and allergy medications for symptoms.  Follow-up as needed

## 2023-12-09 NOTE — ED Triage Notes (Signed)
 Bilateral ear pain started this morning.  Cough and congestion started 3 days ago. Patient reports night sweats.

## 2023-12-12 NOTE — ED Provider Notes (Signed)
 Raven [name]    CSN: [id number] Arrival date & time: 12/09/23  1312      History   Chief Complaint Chief Complaint  Patient presents with   Cough   Nasal Congestion   Chills   Ear Pain    HPI Raven [name] is a 28 y.o. female.   Patient is a 28 year old female who presents today for cough, nasal congestion, chills that started 3 days ago.  Symptoms have been constant.  She is now developed bilateral ear pain that started this morning.  She is also had some night sweats.  Taking over-the-counter medications without any relief.  No fever   Cough   Past Medical History:  Diagnosis Date   Anxiety    Depression    Hypothyroidism    PTSD (post-traumatic stress disorder)    Scoliosis     Patient Active Problem List   Diagnosis Date Noted   Iron  deficiency 04/20/2022   Increased frequency of headaches 04/20/2022   B12 deficiency 03/08/2022   Menorrhagia with irregular cycle 03/01/2022   Abnormal serum protein electrophoresis 03/01/2022   Pregnancy 03/01/2022   Subcutaneous abscess 11/02/2015   Postpartum pelvic abscess 10/31/2015   Pelvic abscess in female 10/31/2015   Post-operative state 10/31/2015   Hx of atrial flutter 10/31/2015    Past Surgical History:  Procedure Laterality Date   CESAREAN SECTION     INCISION AND DRAINAGE ABSCESS Bilateral 11/07/2015   Procedure: INCISION AND DRAINAGE ABSCESS;  Surgeon: Norleen LULLA Server, MD;  Location: [facility];  Service: Gynecology;  Laterality: Bilateral;  c/section incision site   TONSILLECTOMY     WOUND EXPLORATION N/A 11/02/2015   Procedure: WOUND EXPLORATION;  Surgeon: Elveria Mungo, MD;  Location: [facility];  Service: Gynecology;  Laterality: N/A;    OB History     Gravida  4   Para  2   Term  2   Preterm      AB      Living  2      SAB      IAB      Ectopic      Multiple      Live Births  2            Home Medications    Prior to Admission medications    Medication Sig Start Date End Date Taking? Authorizing Provider  busPIRone (BUSPAR) 10 MG tablet Take by mouth.    [provider]  cyanocobalamin  (VITAMIN B12) 1000 MCG/ML injection Inject 1,000 mcg into the muscle.    [provider]  folic acid  (FOLVITE ) 1 MG tablet Take 1 mg by mouth daily.    [provider]  ondansetron  (ZOFRAN -ODT) 4 MG disintegrating tablet Take 4 mg by mouth every 6 (six) hours as needed. 03/04/23   [provider]  Prenatal Multivit-Min-Fe-FA (PRENATAL 1 + IRON  PO) Take by mouth.    [provider]  VENTOLIN HFA 108 (90 Base) MCG/ACT inhaler Inhale 2 puffs into the lungs every 4 (four) hours as needed. 03/04/23   [provider]    Family History Family History  Problem Relation Age of Onset   Heart disease Mother    Thyroid cancer Mother    Hyperlipidemia Father    Hyperlipidemia Maternal Grandmother    Clotting disorder Paternal Grandfather     Social History Social History   Tobacco Use   Smoking status: Former    Types: Cigarettes    Start date: 2014  Smokeless tobacco: Never   Tobacco comments:    1 a day  Vaping Use   Vaping status: Former  Substance Use Topics   Alcohol use: No   Drug use: No     Allergies   Penicillins, Terbutaline, Tylenol  [acetaminophen ], and Sulfa antibiotics   Review of Systems Review of Systems  Respiratory:  Positive for cough.    See HPI  Physical Exam Triage Vital Signs ED Triage Vitals [12/09/23 1336]  Encounter Vitals Group     BP 118/81     Girls Systolic BP Percentile      Girls Diastolic BP Percentile      Boys Systolic BP Percentile      Boys Diastolic BP Percentile      Pulse Rate 88     Resp 16     Temp 98.3 F (36.8 C)     Temp Source Oral     SpO2 96 %     Weight      Height      Head Circumference      Peak Flow      Pain Score      Pain Loc      Pain Education      Exclude from Growth Chart    No data found.  Updated  Vital Signs BP 118/81 (BP Location: Left Arm)   Pulse 88   Temp 98.3 F (36.8 C) (Oral)   Resp 16   LMP 12/09/2023   SpO2 96%   Breastfeeding No   Visual Acuity Right Eye Distance:   Left Eye Distance:   Bilateral Distance:    Right Eye Near:   Left Eye Near:    Bilateral Near:     Physical Exam Constitutional:      General: She is not in acute distress.    Appearance: Normal appearance. She is not ill-appearing, toxic-appearing or diaphoretic.  HENT:     Head: Normocephalic and atraumatic.     Right Ear: Tympanic membrane and ear canal normal.     Left Ear: Tympanic membrane and ear canal normal.     Nose: Congestion and rhinorrhea present.     Mouth/Throat:     Pharynx: Oropharynx is clear.  Eyes:     Conjunctiva/sclera: Conjunctivae normal.  Cardiovascular:     Rate and Rhythm: Normal rate and regular rhythm.     Pulses: Normal pulses.     Heart sounds: Normal heart sounds.  Pulmonary:     Effort: Pulmonary effort is normal.     Breath sounds: Normal breath sounds.  Skin:    General: Skin is warm and dry.  Neurological:     Mental Status: She is alert.  Psychiatric:        Mood and Affect: Mood normal.      UC Treatments / Results  Labs (all labs ordered are listed, but only abnormal results are displayed) Labs Reviewed  POC SOFIA SARS ANTIGEN FIA - Normal    EKG   Radiology No results found.  Procedures Procedures (including critical [name] time)  Medications Ordered in UC Medications - No data to display  Initial Impression / Assessment and Plan / UC Course  I have reviewed the triage vital signs and the nursing notes.  Pertinent labs & imaging results that were available during my [name] of the patient were reviewed by me and considered in my medical decision making (see chart for details).     Acute cough-COVID test negative here today.  No  concerns on exam.  Most likely viral.  Recommend symptomatic treatment for relief of  symptoms. Rest, hydrate and follow-up as needed  Final Clinical Impressions(s) / UC Diagnoses   Final diagnoses:  Acute cough     Discharge Instructions      Covid test negative Most likely something viral.  Recommend over-the-counter medications for symptoms as needed.  You can take Tylenol , ibuprofen , Mucinex and allergy medications for symptoms.  Follow-up as needed    ED Prescriptions   None    PDMP not reviewed this encounter.   Adah Wilbert LABOR, FNP 12/12/23 [phone]
